# Patient Record
Sex: Female | Born: 1965
Health system: Southern US, Community
[De-identification: ages and names within clinical notes are randomized; demographics above are authoritative.]

## PROBLEM LIST (undated history)

## (undated) DIAGNOSIS — U071 COVID-19: Secondary | ICD-10-CM

## (undated) DIAGNOSIS — G629 Polyneuropathy, unspecified: Secondary | ICD-10-CM

## (undated) HISTORY — PX: APPENDECTOMY: SHX54

## (undated) HISTORY — PX: ABDOMINAL HYSTERECTOMY: SHX81

---

## 2002-12-23 ENCOUNTER — Encounter: Payer: Self-pay | Admitting: Emergency Medicine

## 2002-12-23 ENCOUNTER — Observation Stay (HOSPITAL_COMMUNITY): Admission: EM | Admit: 2002-12-23 | Discharge: 2002-12-24 | Payer: Self-pay | Admitting: Emergency Medicine

## 2004-03-08 ENCOUNTER — Emergency Department (HOSPITAL_COMMUNITY): Admission: EM | Admit: 2004-03-08 | Discharge: 2004-03-08 | Payer: Self-pay | Admitting: Emergency Medicine

## 2006-07-16 ENCOUNTER — Ambulatory Visit: Payer: Self-pay | Admitting: Family Medicine

## 2007-04-07 ENCOUNTER — Ambulatory Visit: Payer: Self-pay | Admitting: Family Medicine

## 2007-11-08 ENCOUNTER — Ambulatory Visit: Payer: Self-pay | Admitting: Family Medicine

## 2009-04-18 ENCOUNTER — Ambulatory Visit: Payer: Self-pay | Admitting: Family Medicine

## 2009-12-22 ENCOUNTER — Ambulatory Visit: Payer: Self-pay | Admitting: Family Medicine

## 2009-12-22 DIAGNOSIS — J309 Allergic rhinitis, unspecified: Secondary | ICD-10-CM | POA: Insufficient documentation

## 2010-04-08 NOTE — Assessment & Plan Note (Signed)
Summary: CUT left HAND/EVM   Vital Signs:  Patient Profile:   45 Years Old Female CC:      cut to left hand Height:     63 inches Weight:      161 pounds BMI:     28.62 O2 Sat:      98 % O2 treatment:    Room Air Pulse rate:   87 / minute Resp:     16 per minute BP sitting:   125 / 86  (left arm)  Pt. in pain?   no  Vitals Entered By: Adella Hare LPN (01/19/2010 3:56 PM)                   Current Allergies (reviewed today): ! CODEINEHistory of Present Illness History from: patient Reason for visit: see chief complaint Chief Complaint: cut to left hand History of Present Illness: This patient presented today to be evaluated for a laceration that she sustained in her left finger. She reported that she was cutting and slicing ham and reported that her dog jumped up on her and caused her to slice her finger. She reports that she was able to stop the bleeding with pressure. She decided to go to the urgent care but they were closed today. She came to [employer] for wound care supplies and saw that our office was open. She came in to have her finger evaluated. She reports that she's having some burning sensation in the area of the laceration around the finger. She has not lost sensation in her fingers and she reports that she has not lost strength in the finger or hand.  REVIEW OF SYSTEMS Constitutional Symptoms      Denies fever, chills, night sweats, weight loss, weight gain, and fatigue.  Eyes       Denies change in vision, eye pain, eye discharge, glasses, contact lenses, and eye surgery. Ear/Nose/Throat/Mouth       Denies hearing loss/aids, change in hearing, ear pain, ear discharge, dizziness, frequent runny nose, frequent nose bleeds, sinus problems, sore throat, hoarseness, and tooth pain or bleeding.  Respiratory       Denies dry cough, productive cough, wheezing, shortness of breath, asthma, bronchitis, and emphysema/COPD.  Cardiovascular       Denies murmurs, chest  pain, and tires easily with exhertion.    Gastrointestinal       Denies stomach pain, nausea/vomiting, diarrhea, constipation, blood in bowel movements, and indigestion. Genitourniary       Denies painful urination, kidney stones, and loss of urinary control. Neurological       Denies paralysis, seizures, and fainting/blackouts. Musculoskeletal       Denies muscle pain, joint pain, joint stiffness, decreased range of motion, redness, swelling, muscle weakness, and gout.      Comments: laceration left hand Skin       Denies bruising, unusual mles/lumps or sores, and hair/skin or nail changes.  Psych       Denies mood changes, temper/anger issues, anxiety/stress, speech problems, depression, and sleep problems.  Past History:  Family History: Last updated: 2010-01-19 Both parents deceased with multiple medical problems (pt not sure of specifics)  Social History: Last updated: 2010/01/19 Pt is a long distance truck driver.  Married Alcohol use-no Drug use-no  Past Medical History: Right thumb mauled by dog bite Allergic rhinitis  Past Surgical History: Repair of right thumb from mauled dog bite Hysterectomy  Current Medications (verified): 1)  None  Allergies (verified): 1)  !  Codeine   Family History: Both parents deceased with multiple medical problems (pt not sure of specifics)  Social History: Pt is a long distance Naval architect.  Married Alcohol use-no Drug use-no Drug Use:  no Physical Exam General appearance: well developed, well nourished, no acute distress Head: normocephalic, atraumatic Eyes: conjunctivae and lids normal Pupils: equal, round, reactive to light Ears: normal, no lesions or deformities Nasal: pale, boggy, swollen nasal turbinates Oral/Pharynx: tongue normal, posterior pharynx without erythema or exudate Neck: neck supple,  trachea midline, no masses Chest/Lungs: no rales, wheezes, or rhonchi bilateral, breath sounds equal without  effort Heart: regular rate and  rhythm, no murmur Abdomen: soft, non-tender without obvious organomegaly Extremities: laceration proximal to left second finger at first MP joint approx 1.5 cm in length,  not bleeding, FROM of finger, normal pulses and gross sensation intact Neurological: grossly intact and non-focal Skin: no obvious rashes or lesions MSE: oriented to time, place, and person Assessment New Problems: ALLERGIC RHINITIS (ICD-477.9) OPEN WOUND FINGER WITHOUT MENTION COMPLICATION (ICD-883.0)   Patient Education: Patient and/or caregiver instructed in the following: rest, Ibuprofen prn. wound care instructions provided Demonstrates willingness to comply.  Plan New Orders: Repair laceration Complex  1.1 CM-2.5 CM [13120] Follow Up: Follow up in 2-3 days if no improvement, Follow up on an as needed basis, Follow up with Primary Physician  The patient and/or caregiver has been counseled thoroughly with regard to medications prescribed including dosage, schedule, interactions, rationale for use, and possible side effects and they verbalize understanding.  Diagnoses and expected course of recovery discussed and will return if not improved as expected or if the condition worsens. Patient and/or caregiver verbalized understanding.   PROCEDURE:  Suture Site: left 2nd finger Size: 1.5 cm Number of Lacerations: 1 Anesthesia: 2% lidocaine without epi Procedure: the wound was thoroughly washed and cleansed  and 1 small suture with 4-0 vicryl was placed to close the wound edges.  minimal blood loss noted Follow up: Pt given instructions to have the suture removed within 5 days.  She was told that she can come to our office to have it removed free of charge.   Patient Instructions: 1)  Please schedule an appointment with your primary doctor in : 1 week 2)  Keep the wound clean and put antibiotic ointment on the wound  2 times per day for at least the next 3 days. 3)  Return within 5  days to have the suture removed.  You have only 1 suture. 4)  If you're still having pain in 24 hours call us so we can send you to have an Xray done like we discussed in clinic today but you declined.   5)  Take 650-1000mg  of Tylenol every 4-6 hours as needed for relief of pain or comfort of fever AVOID taking more than 4000mg   in a 24 hour period (can cause liver damage in higher doses).  Orders Added: 1)  Repair laceration Complex  1.1 CM-2.5 CM [13120]

## 2010-05-06 ENCOUNTER — Ambulatory Visit: Payer: Self-pay | Admitting: Family Medicine

## 2010-07-25 NOTE — Op Note (Signed)
Crystal Nixon, Crystal Nixon                             ACCOUNT NO.:  [id number]   MEDICAL RECORD NO.:  [id number]                   PATIENT TYPE:  INP   LOCATION:  1828                                 FACILITY:  [facility]   PHYSICIAN:  Crystal Nixon, M.D.         DATE OF BIRTH:  1965/06/04   DATE OF PROCEDURE:  12/23/2002  DATE OF DISCHARGE:                                 OPERATIVE REPORT   PREOPERATIVE DIAGNOSIS:  Status post right thumb amputation at the distal  phalanx level with exposed bony architecture and nail bed laceration.   POSTOPERATIVE DIAGNOSIS:  Status post right thumb amputation at the distal  phalanx level with exposed bony architecture and nail bed laceration.   PROCEDURE:  1. Irrigation and debridement right thumb open fracture, including skin and     subcutaneous tissue, bone, tendinous tissue  and nailbed.  2. Nailbed repair, right thumb, complex stellate laceration.  3. Moberg advancement flap right thumb.   SURGEON:  Crystal Nixon, M.D.   ASSISTANT:  None.   COMPLICATIONS:  None.   ANESTHESIA:  General.   TOURNIQUET TIME:  Less than 1 hour.   INDICATIONS FOR PROCEDURE:  This patient is a 45 year old female who  sustained an injury today. This was a distal thumb  amputation. The distal  part is not suitable for replantation and I have recommended an irrigation  and debridement and Moberg advancement flap. I have discussed the risks and  benefits of surgery including  the risks of infection, bleeding, anesthesia,  damage to normal structures and failure of surgery to accomplish its  intended goals of  relief of symptoms and restoration of function. With this  the patient desires to proceed. All questions have been encouraged and  answered preoperatively.   DESCRIPTION OF PROCEDURE:  The patient was successfully induced into general  anesthesia and taken to the operative suite. Preoperatively she was  counseled as to the regards of the risks and  benefits of surgery including  the risks of infection, bleeding, anesthesia, damage to normal structures,  failure of surgery to work and hypersensitivity as well as dystrophy, etc.  She was taken to the operating suite and underwent  prophylactic antibiotic  administration. She was placed supine and given a thorough surgical Betadine  scrub and paint preparation. Sterile draping was secured and body parts were  appropriately padded. General anesthesia was employed under the direction of  Crystal Nixon. Crystal Nixon.   Once this was done the patient then had irrigation and debridement of skin  and subcutaneous tissue, bone and tendinous tissue. This also included an  irrigation and debridement of her nailbed. She tolerated this well without  difficulty and there were no complicating features.   Once this was completed, the patient then underwent a complex repair of her  nailbed with 6-0 and 5-0 chromic suture to my satisfaction without  difficulty. Following complex nail repair the  patient then had incisions  made for a Moberg advancement flap. I incised the mid lateral area without  difficulty. This was done without difficulty.   Once the mid lateral areas were incised I then dissected down just off of  the flexor pollicis longus tendon sheath and created a volar flap. The  neurovascular bundles were contained within the volar flap. This was done  under 4.7 magnification meticulously.   Following this the patient had the edge of the thumb tip treated with a  rongeur and I made sure this was a smooth base. I then flexed this thumb IP  joint slightly and inset the flap with a combination of 4-0 Prolene and 4-0  chromic sutures. She tolerated  this well and had excellent refill to both  the dorsal and volar flap regions and no complicating features. This was  done to my satisfaction without difficulty.   This was done with the tourniquet down (that is the insetting of the flap).  Once  this was done she had Adaptic placed under the nailbed to prevent  nailbed adherence and Xeroform as well as Adaptic was applied followed  by  placement  of a sterile wrap and thumb spica splint.   She tolerated this well without difficulty. She was awakened from anesthesia  and transferred to the recovery room in stable condition. She will be  monitored and placed on antibiotics, pain medications, observation and Nixon  precautions postoperatively. I have discussed her these with her etc., and  all questions have been encouraged and answered.                                                Crystal Nixon, M.D.    Crystal Nixon  D:  12/23/2002  T:  12/24/2002  Job:  [id number]   cc:   Crystal Nixon, M.D.

## 2012-05-30 ENCOUNTER — Ambulatory Visit: Payer: Self-pay

## 2012-05-30 LAB — DOT URINE DIP
Blood: NEGATIVE
Glucose,UR: NEGATIVE mg/dL (ref 0–75)
Specific Gravity: 1.02 (ref 1.003–1.030)

## 2012-09-15 ENCOUNTER — Ambulatory Visit: Payer: Self-pay | Admitting: Family Medicine

## 2018-09-15 ENCOUNTER — Emergency Department: Payer: 59

## 2018-09-15 ENCOUNTER — Other Ambulatory Visit: Payer: Self-pay

## 2018-09-15 ENCOUNTER — Encounter: Payer: Self-pay | Admitting: *Deleted

## 2018-09-15 ENCOUNTER — Emergency Department
Admission: EM | Admit: 2018-09-15 | Discharge: 2018-09-15 | Disposition: A | Discharge status: Home / Self Care | Payer: 59 | Attending: Emergency Medicine | Admitting: Emergency Medicine

## 2018-09-15 DIAGNOSIS — U071 COVID-19: Secondary | ICD-10-CM

## 2018-09-15 DIAGNOSIS — R0602 Shortness of breath: Secondary | ICD-10-CM

## 2018-09-15 HISTORY — DX: COVID-19: U07.1

## 2018-09-15 MED ORDER — HYDROCOD POLST-CPM POLST ER 10-8 MG/5ML PO SUER: 5.0000 mL | Freq: Two times a day (BID) | ORAL | 0 refills | Status: DC | PRN

## 2018-09-15 MED ORDER — HYDROCOD POLST-CPM POLST ER 10-8 MG/5ML PO SUER
5.0000 mL | Freq: Once | ORAL | Status: AC
  Administered 2018-09-15: 5 mL via ORAL
  Filled 2018-09-15: qty 5

## 2018-09-15 MED ORDER — AZITHROMYCIN 250 MG PO TABS: ORAL_TABLET | ORAL | 0 refills | Status: AC

## 2018-09-15 NOTE — ED Triage Notes (Signed)
Pt tested for Covid 19 x 5 days ago and was positive. Pt having increased shortness of breath and is coughing purulent sputum.

## 2018-09-15 NOTE — ED Provider Notes (Signed)
[facility] Emergency Department Provider Note  Time seen: 4:49 AM  I have reviewed the triage vital signs and the nursing notes.   HISTORY  Chief Complaint Shortness of Breath (Covid 19 positive)   HPI Crystal Nixon is a 53 y.o. female with no significant past medical history presents to the emergency department for worsening shortness of breath.  According to the patient for the past 7 days she has been experiencing cough fever congestion, 3 days ago patient tested positive for coronavirus.  Patient states overnight tonight she became very short of breath and was concerned.  States her brother passed away from coronavirus several months ago.  Upon arrival to the emergency department patient states she is feeling better, currently satting between 94 and 98% on room air.  Does have occasional cough.  States she has been experiencing green sputum with her cough.  Denies any chest pain.   Past Medical History:  Diagnosis Date  . COVID-19 virus infection     Patient Active Problem List   Diagnosis Date Noted  . ALLERGIC RHINITIS 12/22/2009    Past Surgical History:  Procedure Laterality Date  . ABDOMINAL HYSTERECTOMY    . APPENDECTOMY      Prior to Admission medications   Not on File    Allergies  Allergen Reactions  . Codeine     History reviewed. No pertinent family history.  Social History Social History   Tobacco Use  . Smoking status: Never Smoker  . Smokeless tobacco: Never Used  Substance Use Topics  . Alcohol use: Never    Frequency: Never  . Drug use: Never    Review of Systems Constitutional: Positive for fever ENT: Positive for congestion Cardiovascular: Negative for chest pain. Respiratory: Positive for shortness of breath.  Positive for cough.  Positive for sputum production. Gastrointestinal: Negative for abdominal pain, vomiting  Musculoskeletal: Negative for musculoskeletal complaints Skin: Negative for skin complaints   Neurological: Negative for headache All other ROS negative  ____________________________________________   PHYSICAL EXAM:  VITAL SIGNS: ED Triage Vitals  Enc Vitals Group     BP 09/15/18 0245 (!) 179/85     Pulse Rate 09/15/18 0245 82     Resp 09/15/18 0245 12     Temp 09/15/18 0246 98.5 F (36.9 C)     Temp Source 09/15/18 0246 Oral     SpO2 09/15/18 0245 95 %     Weight 09/15/18 0248 175 lb (79.4 kg)     Height 09/15/18 0248 5\' 2"  (1.575 m)     Head Circumference --      Peak Flow --      Pain Score 09/15/18 0247 5     Pain Loc --      Pain Edu? --      Excl. in [place]? --    Constitutional: Alert and oriented. Well appearing and in no distress. Eyes: Normal exam ENT      Head: Normocephalic and atraumatic.      Mouth/Throat: Mucous membranes are moist. Cardiovascular: Normal rate, regular rhythm. No murmur Respiratory: Normal respiratory effort without tachypnea nor retractions. Breath sounds are clear.  Frequent cough. Gastrointestinal: Soft and nontender. No distention.   Musculoskeletal: Nontender with normal range of motion in all extremities.  Neurologic:  Normal speech and language. No gross focal neurologic deficits Skin:  Skin is warm, dry and intact.  Psychiatric: Mood and affect are normal.  ____________________________________________   RADIOLOGY  Chest x-ray shows bilateral airspace opacities.  ____________________________________________   INITIAL IMPRESSION / ASSESSMENT AND PLAN / ED COURSE  Pertinent labs & imaging results that were available during my care of the patient were reviewed by me and considered in my medical decision making (see chart for details).   Patient presents to the emergency department for shortness of breath.  Patient tested positive for corona virus 3 days ago.  Patient is describing a initial dry cough that has progressed to a wet cough now with sputum production green in color per patient.  X-ray shows bilateral airspace  opacities.  X-ray is very suggestive of viral pneumonia which would go along with the patient's positive COVID status.  She is currently satting 97% during my evaluation on room air.  However given the progression of a dry cough to sputum production of green sputum production we will place the patient on Zithromax and a cough medication as a precaution.  We will have the patient follow-up with her doctor.  Patient agreeable to plan of care.  Crystal Nixon was evaluated in Emergency Department on 09/15/2018 for the symptoms described in the history of present illness. She was evaluated in the context of the global COVID-19 pandemic, which necessitated consideration that the patient might be at risk for infection with the SARS-CoV-2 virus that causes COVID-19. Institutional protocols and algorithms that pertain to the evaluation of patients at risk for COVID-19 are in a state of rapid change based on information released by regulatory bodies including the CDC and federal and state organizations. These policies and algorithms were followed during the patient's care in the ED.  ____________________________________________   FINAL CLINICAL IMPRESSION(S) / ED DIAGNOSES  COVID-19   Minna AntisPaduchowski, Christabella Alvira, MD 09/15/18 [phone]

## 2018-09-15 NOTE — ED Notes (Signed)
Reference triage note. Pt respirations currently even and unlabored. Pt c/o worsening shortness of breath and cough from recent covid-19 diagnosis,.

## 2018-09-15 NOTE — ED Notes (Signed)
E-signature not working at this time. Pt verbalized understanding of D/C instructions. No further questions at this time. Pt ambulatory and in NAD at time of Martin,

## 2018-09-23 ENCOUNTER — Other Ambulatory Visit: Payer: Self-pay

## 2018-09-23 ENCOUNTER — Encounter: Payer: Self-pay | Admitting: *Deleted

## 2018-09-23 ENCOUNTER — Emergency Department: Payer: 59

## 2018-09-23 ENCOUNTER — Emergency Department
Admission: EM | Admit: 2018-09-23 | Discharge: 2018-09-24 | Disposition: A | Discharge status: Home / Self Care | Payer: 59 | Attending: Emergency Medicine | Admitting: Emergency Medicine

## 2018-09-23 DIAGNOSIS — U071 COVID-19: Secondary | ICD-10-CM | POA: Diagnosis not present

## 2018-09-23 DIAGNOSIS — R05 Cough: Secondary | ICD-10-CM | POA: Diagnosis present

## 2018-09-23 DIAGNOSIS — R0602 Shortness of breath: Secondary | ICD-10-CM | POA: Insufficient documentation

## 2018-09-23 DIAGNOSIS — Z20822 Contact with and (suspected) exposure to covid-19: Secondary | ICD-10-CM

## 2018-09-23 MED ORDER — HYDROCOD POLST-CPM POLST ER 10-8 MG/5ML PO SUER: 5.0000 mL | Freq: Two times a day (BID) | ORAL | 0 refills | Status: DC | PRN

## 2018-09-23 NOTE — ED Provider Notes (Signed)
[facility] Emergency Department Provider Note  Time seen: 10:36 PM  I have reviewed the triage vital signs and the nursing notes.   HISTORY  Chief Complaint Shortness of Breath   HPI Crystal Nixon is a 53 y.o. female diagnosed with coronavirus 09/14/2018 presents to the emergency department for continued cough and shortness of breath.  According to the patient she is continued to have low-grade temperatures around 99, states she finished her cough medication and antibiotic however continues to have cough and shortness of breath.  States the health department has been calling her daily and today she was complaining of worsening cough so they said she should come back to the emergency department.  Patient states she has a pulse oximeter at home, her pulse ox has maintained between 90 to 98% throughout her illness per patient.  Denies any significant shortness of breath or chest pain at this time but does state increased cough.  Denies abdominal pain vomiting but does state occasional diarrhea.  Currently the patient appears well, sitting in a chair, no acute distress, occasional cough during examination.  Past Medical History:  Diagnosis Date  . COVID-19 virus infection     Patient Active Problem List   Diagnosis Date Noted  . ALLERGIC RHINITIS 12/22/2009    Past Surgical History:  Procedure Laterality Date  . ABDOMINAL HYSTERECTOMY    . APPENDECTOMY      Prior to Admission medications   Medication Sig Start Date End Date Taking? Authorizing Provider  chlorpheniramine-HYDROcodone (TUSSIONEX PENNKINETIC ER) 10-8 MG/5ML SUER Take 5 mLs by mouth every 12 (twelve) hours as needed for cough. 09/15/18   Harvest Dark, MD    Allergies  Allergen Reactions  . Codeine     No family history on file.  Social History Social History   Tobacco Use  . Smoking status: Never Smoker  . Smokeless tobacco: Never Used  Substance Use Topics  . Alcohol use: Never   Frequency: Never  . Drug use: Never    Review of Systems Constitutional: Intermittent low-grade fevers ENT: Mild congestion Cardiovascular: Negative for chest pain. Respiratory: Shortness of breath is improving, continues to have cough. Gastrointestinal: Negative for abdominal pain, vomiting Musculoskeletal: Negative for musculoskeletal complaints Skin: Negative for skin complaints  Neurological: Negative for headache All other ROS negative  ____________________________________________   PHYSICAL EXAM:  VITAL SIGNS: ED Triage Vitals  Enc Vitals Group     BP 09/23/18 2203 (!) 164/125     Pulse Rate 09/23/18 2203 93     Resp 09/23/18 2203 (!) 22     Temp 09/23/18 2203 99.2 F (37.3 C)     Temp Source 09/23/18 2203 Oral     SpO2 09/23/18 2203 97 %     Weight --      Height --      Head Circumference --      Peak Flow --      Pain Score 09/23/18 2204 0     Pain Loc --      Pain Edu? --      Excl. in [place]? --    Constitutional: Alert and oriented. Well appearing and in no distress. Eyes: Normal exam ENT      Head: Normocephalic and atraumatic.      Mouth/Throat: Mucous membranes are moist. Cardiovascular: Normal rate, regular rhythm. No murmur Respiratory: Normal respiratory effort without tachypnea nor retractions. Breath sounds are clear.  Occasional cough during exam. Gastrointestinal: Soft and nontender. No distention.  Musculoskeletal:  Nontender with normal range of motion in all extremities. Neurologic:  Normal speech and language. No gross focal neurologic deficits  Skin:  Skin is warm, dry and intact.  Psychiatric: Mood and affect are normal.      RADIOLOGY  Chest x-ray appears clear.  ____________________________________________   INITIAL IMPRESSION / ASSESSMENT AND PLAN / ED COURSE  Pertinent labs & imaging results that were available during my care of the patient were reviewed by me and considered in my medical decision making (see chart for  details).   Patient presents emergency department for cough, known COVID positive status.  Differential would include worsening coronavirus, continued coronavirus illness, pneumonia.  Overall the patient appears well, low-grade temperature 99.2, 97% room air saturation, no acute distress.  We will repeat a chest x-ray and continue to closely monitor.  As the patient is satting in the upper 90s on room air anticipate likely discharge home as long as her chest x-ray appears to improve.  Chest x-ray appears clear.  Patient satting in the upper 90s.  I discussed continued supportive care at home, as well as return precautions.  Crystal Nixon was evaluated in Emergency Department on 09/23/2018 for the symptoms described in the history of present illness. She was evaluated in the context of the global COVID-19 pandemic, which necessitated consideration that the patient might be at risk for infection with the SARS-CoV-2 virus that causes COVID-19. Institutional protocols and algorithms that pertain to the evaluation of patients at risk for COVID-19 are in a state of rapid change based on information released by regulatory bodies including the CDC and federal and state organizations. These policies and algorithms were followed during the patient's care in the ED.  ____________________________________________   FINAL CLINICAL IMPRESSION(S) / ED DIAGNOSES  COVID-19   Minna AntisPaduchowski, Lyal Husted, MD 09/23/18 2240

## 2018-09-23 NOTE — ED Notes (Signed)
ED Provider at bedside.  Pt reports cough, and dx of COVID on 8 July, also at that time pt reports dx of bilateral PNE, pt reports pulse OX at home between 92-98%, pt reports regular tylenol use for fever, pt reports regular contact at home with health dept, pt reports also taking cough suppressant and good fluid intake, and some diarrhea from taking Azithro  EDP coaching pt on COVID s/sx management

## 2018-09-23 NOTE — ED Triage Notes (Signed)
Pt ambulatory to triage. Pt is positive for covid.  Pt states she is more sob.  No chest pain.  Pt is a long distance truck driver.  Pt alert speech clear.  Pt taken to room 4.

## 2018-09-24 NOTE — ED Notes (Signed)
No peripheral IV placed this visit.   Discharge instructions reviewed with patient. Questions fielded by this RN. Patient verbalizes understanding of instructions. Patient discharged home in stable condition per paduchowski. No acute distress noted at time of discharge.   Pt ambulatory to DC

## 2018-09-28 ENCOUNTER — Telehealth: Payer: Self-pay | Admitting: General Practice

## 2018-09-28 LAB — NOVEL CORONAVIRUS, NAA: SARS-CoV-2, NAA: NOT DETECTED

## 2018-09-28 NOTE — Telephone Encounter (Signed)
Pt needs the generic lady of her negative test results mailed to her home. Pt has no internet at the moment and only a flip phone.  A duplicate chart was created on this pt, so her results are in the [facility] chart. I have marked the chart for merge.

## 2018-09-28 NOTE — Telephone Encounter (Signed)
Per pt's request, mailed negative COVID test results to home address.

## 2018-10-03 ENCOUNTER — Other Ambulatory Visit: Payer: Self-pay

## 2018-10-03 DIAGNOSIS — Z20822 Contact with and (suspected) exposure to covid-19: Secondary | ICD-10-CM

## 2018-10-06 LAB — NOVEL CORONAVIRUS, NAA: SARS-CoV-2, NAA: NOT DETECTED

## 2018-10-10 ENCOUNTER — Telehealth: Payer: Self-pay

## 2018-10-10 NOTE — Telephone Encounter (Signed)
Per patient request, mailed COVID test results to her home address.

## 2019-01-12 ENCOUNTER — Ambulatory Visit (INDEPENDENT_AMBULATORY_CARE_PROVIDER_SITE_OTHER): Payer: 59 | Admitting: Family Medicine

## 2019-01-12 ENCOUNTER — Encounter: Payer: Self-pay | Admitting: Family Medicine

## 2019-01-12 ENCOUNTER — Other Ambulatory Visit: Payer: Self-pay

## 2019-01-12 VITALS — BP 167/101 | HR 89 | Temp 96.9°F | Ht 62.0 in | Wt 177.0 lb

## 2019-01-12 DIAGNOSIS — Z6832 Body mass index (BMI) 32.0-32.9, adult: Secondary | ICD-10-CM

## 2019-01-12 DIAGNOSIS — G629 Polyneuropathy, unspecified: Secondary | ICD-10-CM | POA: Diagnosis not present

## 2019-01-12 DIAGNOSIS — I10 Essential (primary) hypertension: Secondary | ICD-10-CM

## 2019-01-12 DIAGNOSIS — N951 Menopausal and female climacteric states: Secondary | ICD-10-CM

## 2019-01-12 DIAGNOSIS — E669 Obesity, unspecified: Secondary | ICD-10-CM | POA: Diagnosis not present

## 2019-01-12 DIAGNOSIS — Z803 Family history of malignant neoplasm of breast: Secondary | ICD-10-CM

## 2019-01-12 DIAGNOSIS — Z1231 Encounter for screening mammogram for malignant neoplasm of breast: Secondary | ICD-10-CM

## 2019-01-12 MED ORDER — GABAPENTIN 300 MG PO CAPS: 300.0000 mg | ORAL_CAPSULE | Freq: Three times a day (TID) | ORAL | 3 refills | Status: DC

## 2019-01-12 MED ORDER — HYDROCHLOROTHIAZIDE 12.5 MG PO TABS: 12.5000 mg | ORAL_TABLET | Freq: Every day | ORAL | 3 refills | Status: DC

## 2019-01-12 NOTE — Progress Notes (Signed)
Patient: Crystal Nixon, Female    DOB: 1965/10/20, 53 y.o.   MRN: [id number] Visit Date: 01/12/2019  Today's Provider: Shirlee Latch, MD   Chief Complaint  Patient presents with  . Establish Care  . Hypertension  . Foot Pain   Subjective:     Annual physical exam Crystal Nixon is a 53 y.o. female who presents today to establish care. She feels fairly well.  She reports exercising regularly. She reports she is sleeping well.   She states she has not had a primary care doctor in many years.  She works as a Naval architect. ----------------------------------------------------------------- Pt is complaining about numbness and pain in her feet. Pt states she takes "30-35 Ibuprofen 200mg  a day". She tried a frined's gabapentin and it helps.  Affecting her job as a .  No history of alcohol use or diabetes.  Ongoing for a few years.  Not worsening.  Patient has also been experiencing hot flashes and sweating for a few months.  She also has decreased libido and vaginal dryness.  Lubricant is helping some with this.  She had a hysterectomy in 2002.  She is wondering whether she is menopausal  She has noticed that her blood pressure is running high for many months while checking it at home.  She occasionally takes her boyfriends HCTZ, but she has never been diagnosed with hypertension or taken a medication for it that was prescribed.  Family history of breast cancer in mother and MGM.  States that she was told that she needed extra imaging and bioppsy after last mammogram and she never went back.   Review of Systems  Constitutional: Positive for diaphoresis and fatigue. Negative for activity change, appetite change, chills, fever and unexpected weight change.  HENT: Negative.   Eyes: Negative.   Respiratory: Negative.   Cardiovascular: Negative.   Gastrointestinal: Negative.   Endocrine: Negative.   Genitourinary: Negative.   Musculoskeletal: Negative.    Skin: Positive for rash (Constant rashes/breakouts). Negative for color change, pallor and wound.  Allergic/Immunologic: Negative.   Neurological: Negative.   Hematological: Negative.   Psychiatric/Behavioral: Negative.     Social History      She  reports that she has never smoked. She has never used smokeless tobacco. She reports that she does not drink alcohol or use drugs.       Social History   Socioeconomic History  . Marital status: Married    Spouse name: Not on file  . Number of children: Not on file  . Years of education: Not on file  . Highest education level: Not on file  Occupational History  . Not on file  Social Needs  . Financial resource strain: Not on file  . Food insecurity    Worry: Not on file    Inability: Not on file  . Transportation needs    Medical: Not on file    Non-medical: Not on file  Tobacco Use  . Smoking status: Never Smoker  . Smokeless tobacco: Never Used  Substance and Sexual Activity  . Alcohol use: Never    Frequency: Never  . Drug use: Never  . Sexual activity: Not on file  Lifestyle  . Physical activity    Days per week: Not on file    Minutes per session: Not on file  . Stress: Not on file  Relationships  . Social 2003 on phone: Not on file    Gets together:  Not on file    Attends religious service: Not on file    Active member of club or organization: Not on file    Attends meetings of clubs or organizations: Not on file    Relationship status: Not on file  Other Topics Concern  . Not on file  Social History Narrative  . Not on file    Past Medical History:  Diagnosis Date  . COVID-19 virus infection      Patient Active Problem List   Diagnosis Date Noted  . ALLERGIC RHINITIS 12/22/2009    Past Surgical History:  Procedure Laterality Date  . ABDOMINAL HYSTERECTOMY    . APPENDECTOMY      Family History        Family Status  Relation Name Status  . Mother  Deceased  . Father   Deceased  . Brother  Deceased  . MGM  Deceased        Her family history includes ALS in her mother; Breast cancer in her maternal grandmother and mother; Stroke in her father.      Allergies  Allergen Reactions  . Codeine      Current Outpatient Medications:  .  chlorpheniramine-HYDROcodone (TUSSIONEX PENNKINETIC ER) 10-8 MG/5ML SUER, Take 5 mLs by mouth every 12 (twelve) hours as needed for cough., Disp: 140 mL, Rfl: 0   Patient Care Team: Erasmo DownerBacigalupo, Jax Kentner M, MD as PCP - General (Family Medicine)    Objective:    Vitals: BP (!) 167/97 (BP Location: Right Arm, Patient Position: Sitting, Cuff Size: Normal)   Pulse 89   Temp (!) 96.9 F (36.1 C) (Axillary)   Ht 5\' 2"  (1.575 m)   Wt 177 lb (80.3 kg)   BMI 32.37 kg/m    Vitals:   01/12/19 1445  BP: (!) 167/97  Pulse: 89  Temp: (!) 96.9 F (36.1 C)  TempSrc: Axillary  Weight: 177 lb (80.3 kg)  Height: 5\' 2"  (1.575 m)     Physical Exam Vitals signs reviewed.  Constitutional:      General: She is not in acute distress.    Appearance: Normal appearance. She is well-developed. She is not diaphoretic.  HENT:     Head: Normocephalic and atraumatic.     Right Ear: Tympanic membrane, ear canal and external ear normal.     Left Ear: Tympanic membrane, ear canal and external ear normal.  Eyes:     General: No scleral icterus.    Conjunctiva/sclera: Conjunctivae normal.     Pupils: Pupils are equal, round, and reactive to light.  Neck:     Musculoskeletal: Neck supple.     Thyroid: No thyromegaly.  Cardiovascular:     Rate and Rhythm: Normal rate and regular rhythm.     Pulses: Normal pulses.     Heart sounds: Normal heart sounds. No murmur.  Pulmonary:     Effort: Pulmonary effort is normal. No respiratory distress.     Breath sounds: Normal breath sounds. No wheezing or rales.  Abdominal:     General: There is no distension.     Palpations: Abdomen is soft.     Tenderness: There is no abdominal tenderness.   Musculoskeletal:        General: No deformity.     Right lower leg: No edema.     Left lower leg: No edema.  Lymphadenopathy:     Cervical: No cervical adenopathy.  Skin:    General: Skin is warm and dry.     Capillary Refill:  Capillary refill takes less than 2 seconds.     Findings: No rash.  Neurological:     Mental Status: She is alert and oriented to person, place, and time. Mental status is at baseline.     Sensory: Sensory deficit (in bilateral feet) present.  Psychiatric:        Mood and Affect: Mood normal.        Behavior: Behavior normal.        Thought Content: Thought content normal.      Depression Screen No flowsheet data found.     Assessment & Plan:     Establish care  Exercise Activities and Dietary recommendations Goals   None      There is no immunization history on file for this patient.  Health Maintenance  Topic Date Due  . HIV Screening  05/14/1981  . TETANUS/TDAP  05/14/1985  . PAP SMEAR-Modifier  05/15/1987  . MAMMOGRAM  05/14/2016  . COLONOSCOPY  05/14/2016  . INFLUENZA VACCINE  06/07/2019 (Originally 10/08/2018)     Discussed health benefits of physical activity, and encouraged her to engage in regular exercise appropriate for her age and condition.    -------------------------------------------------------------------- Problem List Items Addressed This Visit      Cardiovascular and Mediastinum   Essential hypertension    New diagnosis Seems it has been ongoing for many months to years Discussed low-sodium diet and exercise Discussed importance of good blood pressure control Start HCTZ 12.5 mg daily Check metabolic panel At follow-up, consider dose titration      Relevant Medications   hydrochlorothiazide (HYDRODIURIL) 12.5 MG tablet   Other Relevant Orders   CMP (Comprehensive metabolic panel) (Completed)     Nervous and Auditory   Peripheral polyneuropathy - Primary    New diagnosis Unclear etiology, but will check  B12 and A1c 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 Given that it is bilateral, doubt that it is a more central problem Advised patient to significantly cut back/avoid NSAID use Check metabolic panel to ensure no kidney injury in the setting of excessive NSAID use Start gabapentin 300 mg 3 times daily At follow-up, can consider dose titration if needed      Relevant Medications   gabapentin (NEURONTIN) 300 MG capsule   Other Relevant Orders   B12 (Completed)   CMP (Comprehensive metabolic panel) (Completed)   Hemoglobin A1c (Completed)   CBC (Completed)   TSH (Completed)     Other   Class 1 obesity without serious comorbidity with body mass index (BMI) of 32.0 to 32.9 in adult    Discussed importance of healthy weight management Discussed diet and exercise      Relevant Orders   CMP (Comprehensive metabolic panel) (Completed)   Lipid panel (Completed)   Hemoglobin A1c (Completed)   CBC (Completed)   TSH (Completed)   Family history of breast cancer    Given patient's significant family history of breast cancer, I encouraged her to get annual mammograms, especially if she has had an abnormal one in the past (results are not available for this) She agrees to call and schedule a mammogram      Relevant Orders   MM 3D SCREEN BREAST BILATERAL   Vasomotor symptoms due to menopause    Patient is likely perimenopausal or postmenopausal We will check FSH and LH today Discussed symptomatic management and lifestyle changes Gabapentin is likely to also help with hot flashes      Relevant Orders   FSH/LH (Completed)  Other Visit Diagnoses    Encounter for screening mammogram for malignant neoplasm of breast       Relevant Orders   MM 3D SCREEN BREAST BILATERAL       Return in about 6 weeks (around 02/23/2019) for neuropathy and BP f/u.   The entirety of the information documented in the History of Present Illness, Review of Systems and Physical  Exam were personally obtained by me. Portions of this information were initially documented by Ashley Royalty, CMA and reviewed by me for thoroughness and accuracy.    Brenda Cowher, Dionne Bucy, MD MPH [facility]

## 2019-01-12 NOTE — Patient Instructions (Signed)
Neuropathic Pain Neuropathic pain is pain caused by damage to the nerves that are responsible for certain sensations in your body (sensory nerves). The pain can be caused by:  Damage to the sensory nerves that send signals to your spinal cord and brain (peripheral nervous system).  Damage to the sensory nerves in your brain or spinal cord (central nervous system). Neuropathic pain can make you more sensitive to pain. Even a minor sensation can feel very painful. This is usually a long-term condition that can be difficult to treat. The type of pain differs from person to person. It may:  Start suddenly (acute), or it may develop slowly and last for a long time (chronic).  Come and go as damaged nerves heal, or it may stay at the same level for years.  Cause emotional distress, loss of sleep, and a lower quality of life. What are the causes? The most common cause of this condition is diabetes. Many other diseases and conditions can also cause neuropathic pain. Causes of neuropathic pain can be classified as:  Toxic. This is caused by medicines and chemicals. The most common cause of toxic neuropathic pain is damage from cancer treatments (chemotherapy).  Metabolic. This can be caused by: ? Diabetes. This is the most common disease that damages the nerves. ? Lack of vitamin B from long-term alcohol abuse.  Traumatic. Any injury that cuts, crushes, or stretches a nerve can cause damage and pain. A common example is feeling pain after losing an arm or leg (phantom limb pain).  Compression-related. If a sensory nerve gets trapped or compressed for a long period of time, the blood supply to the nerve can be cut off.  Vascular. Many blood vessel diseases can cause neuropathic pain by decreasing blood supply and oxygen to nerves.  Autoimmune. This type of pain results from diseases in which the body's defense system (immune system) mistakenly attacks sensory nerves. Examples of autoimmune diseases  that can cause neuropathic pain include lupus and multiple sclerosis.  Infectious. Many types of viral infections can damage sensory nerves and cause pain. Shingles infection is a common cause of this type of pain.  Inherited. Neuropathic pain can be a symptom of many diseases that are passed down through families (genetic). What increases the risk? You are more likely to develop this condition if:  You have diabetes.  You smoke.  You drink too much alcohol.  You are taking certain medicines, including medicines that kill cancer cells (chemotherapy) or that treat immune system disorders. What are the signs or symptoms? The main symptom is pain. Neuropathic pain is often described as:  Burning.  Shock-like.  Stinging.  Hot or cold.  Itching. How is this diagnosed? No single test can diagnose neuropathic pain. It is diagnosed based on:  Physical exam and your symptoms. Your health care provider will ask you about your pain. You may be asked to use a pain scale to describe how bad your pain is.  Tests. These may be done to see if you have a high sensitivity to pain and to help find the cause and location of any sensory nerve damage. They include: ? Nerve conduction studies to test how well nerve signals travel through your sensory nerves (electrodiagnostic testing). ? Stimulating your sensory nerves through electrodes on your skin and measuring the response in your spinal cord and brain (somatosensory evoked potential).  Imaging studies, such as: ? X-rays. ? CT scan. ? MRI. How is this treated? Treatment for neuropathic pain may change   over time. You may need to try different treatment options or a combination of treatments. Some options include:  Treating the underlying cause of the neuropathy, such as diabetes, kidney disease, or vitamin deficiencies.  Stopping medicines that can cause neuropathy, such as chemotherapy.  Medicine to relieve pain. Medicines may  include: ? Prescription or over-the-counter pain medicine. ? Anti-seizure medicine. ? Antidepressant medicines. ? Pain-relieving patches that are applied to painful areas of skin. ? A medicine to numb the area (local anesthetic), which can be injected as a nerve block.  Transcutaneous nerve stimulation. This uses electrical currents to block painful nerve signals. The treatment is painless.  Alternative treatments, such as: ? Acupuncture. ? Meditation. ? Massage. ? Physical therapy. ? Pain management programs. ? Counseling. Follow these instructions at home: Medicines   Take over-the-counter and prescription medicines only as told by your health care provider.  Do not drive or use heavy machinery while taking prescription pain medicine.  If you are taking prescription pain medicine, take actions to prevent or treat constipation. Your health care provider may recommend that you: ? Drink enough fluid to keep your urine pale yellow. ? Eat foods that are high in fiber, such as fresh fruits and vegetables, whole grains, and beans. ? Limit foods that are high in fat and processed sugars, such as fried or sweet foods. ? Take an over-the-counter or prescription medicine for constipation. Lifestyle   Have a good support system at home.  Consider joining a chronic pain support group.  Do not use any products that contain nicotine or tobacco, such as cigarettes and e-cigarettes. If you need help quitting, ask your health care provider.  Do not drink alcohol. General instructions  Learn as much as you can about your condition.  Work closely with all your health care providers to find the treatment plan that works best for you.  Ask your health care provider what activities are safe for you.  Keep all follow-up visits as told by your health care provider. This is important. Contact a health care provider if:  Your pain treatments are not working.  You are having side effects  from your medicines.  You are struggling with tiredness (fatigue), mood changes, depression, or anxiety. Summary  Neuropathic pain is pain caused by damage to the nerves that are responsible for certain sensations in your body (sensory nerves).  Neuropathic pain may come and go as damaged nerves heal, or it may stay at the same level for years.  Neuropathic pain is usually a long-term condition that can be difficult to treat. Consider joining a chronic pain support group. This information is not intended to replace advice given to you by your health care provider. Make sure you discuss any questions you have with your health care provider. Document Released: 11/21/2003 Document Revised: 06/16/2018 Document Reviewed: 03/12/2017 Elsevier Patient Education  2020 [employer]  

## 2019-01-13 ENCOUNTER — Telehealth: Payer: Self-pay

## 2019-01-13 DIAGNOSIS — Z803 Family history of malignant neoplasm of breast: Secondary | ICD-10-CM | POA: Insufficient documentation

## 2019-01-13 DIAGNOSIS — I10 Essential (primary) hypertension: Secondary | ICD-10-CM | POA: Insufficient documentation

## 2019-01-13 DIAGNOSIS — G629 Polyneuropathy, unspecified: Secondary | ICD-10-CM | POA: Insufficient documentation

## 2019-01-13 DIAGNOSIS — N951 Menopausal and female climacteric states: Secondary | ICD-10-CM | POA: Insufficient documentation

## 2019-01-13 DIAGNOSIS — E669 Obesity, unspecified: Secondary | ICD-10-CM | POA: Insufficient documentation

## 2019-01-13 LAB — CBC
Hematocrit: 40 % (ref 34.0–46.6)
Hemoglobin: 13.2 g/dL (ref 11.1–15.9)
MCH: 26.6 pg (ref 26.6–33.0)
MCHC: 33 g/dL (ref 31.5–35.7)
MCV: 81 fL (ref 79–97)
Platelets: 487 10*3/uL — ABNORMAL HIGH (ref 150–450)
RBC: 4.97 x10E6/uL (ref 3.77–5.28)
RDW: 13.6 % (ref 11.7–15.4)
WBC: 12 10*3/uL — ABNORMAL HIGH (ref 3.4–10.8)

## 2019-01-13 LAB — COMPREHENSIVE METABOLIC PANEL
ALT: 26 IU/L (ref 0–32)
AST: 21 IU/L (ref 0–40)
Albumin/Globulin Ratio: 1.6 (ref 1.2–2.2)
Albumin: 4.6 g/dL (ref 3.8–4.9)
Alkaline Phosphatase: 98 IU/L (ref 39–117)
BUN/Creatinine Ratio: 23 (ref 9–23)
BUN: 16 mg/dL (ref 6–24)
Bilirubin Total: <0.2 mg/dL (ref 0.0–1.2)
CO2: 24 mmol/L (ref 20–29)
Calcium: 10 mg/dL (ref 8.7–10.2)
Chloride: 102 mmol/L (ref 96–106)
Creatinine, Ser: 0.71 mg/dL (ref 0.57–1.00)
GFR calc Af Amer: 113 mL/min/{1.73_m2} (ref >59)
GFR calc non Af Amer: 98 mL/min/{1.73_m2} (ref >59)
Globulin, Total: 2.9 g/dL (ref 1.5–4.5)
Glucose: 78 mg/dL (ref 65–99)
Potassium: 4.4 mmol/L (ref 3.5–5.2)
Sodium: 141 mmol/L (ref 134–144)
Total Protein: 7.5 g/dL (ref 6.0–8.5)

## 2019-01-13 LAB — HEMOGLOBIN A1C
Est. average glucose Bld gHb Est-mCnc: 114 mg/dL
Hgb A1c MFr Bld: 5.6 % (ref 4.8–5.6)

## 2019-01-13 LAB — TSH: TSH: 1.42 u[IU]/mL (ref 0.450–4.500)

## 2019-01-13 LAB — FSH/LH
FSH: 70.3 m[IU]/mL
LH: 39.3 m[IU]/mL

## 2019-01-13 LAB — LIPID PANEL
Chol/HDL Ratio: 6.7 ratio — ABNORMAL HIGH (ref 0.0–4.4)
Cholesterol, Total: 228 mg/dL — ABNORMAL HIGH (ref 100–199)
HDL: 34 mg/dL — ABNORMAL LOW (ref >39)
LDL Chol Calc (NIH): 95 mg/dL (ref 0–99)
Triglycerides: 594 mg/dL (ref 0–149)
VLDL Cholesterol Cal: 99 mg/dL — ABNORMAL HIGH (ref 5–40)

## 2019-01-13 LAB — VITAMIN B12: Vitamin B-12: 509 pg/mL (ref 232–1245)

## 2019-01-13 NOTE — Assessment & Plan Note (Signed)
Discussed importance of healthy weight management Discussed diet and exercise  

## 2019-01-13 NOTE — Assessment & Plan Note (Signed)
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

## 2019-01-13 NOTE — Assessment & Plan Note (Signed)
Given patient's significant family history of breast cancer, I encouraged her to get annual mammograms, especially if she has had an abnormal one in the past (results are not available for this) She agrees to call and schedule a mammogram

## 2019-01-13 NOTE — Assessment & Plan Note (Signed)
New diagnosis Seems it has been ongoing for many months to years Discussed low-sodium diet and exercise Discussed importance of good blood pressure control Start HCTZ 12.5 mg daily Check metabolic panel At follow-up, consider dose titration

## 2019-01-13 NOTE — Assessment & Plan Note (Signed)
Patient is likely perimenopausal or postmenopausal We will check Krebs and LH today Discussed symptomatic management and lifestyle changes Gabapentin is likely to also help with hot flashes

## 2019-01-13 NOTE — Telephone Encounter (Signed)
-----   Message from Virginia Crews, MD sent at 01/13/2019  8:54 AM EST ----- Normal labs - except:  Patient is postmenopausal.  Normal A1c, no diabetes.  Cholesterol is elevated, but especially her triglycerides.  Recommend decreasing carbohydrates in her diet.  Would recheck fasting at her next visit.  White blood cell count and platelet count are slightly elevated.  Suspect that patient was slightly dehydrated and this is reflective of hemoconcentration.  We can recheck this again at next visit as well

## 2019-01-13 NOTE — Telephone Encounter (Signed)
Pt advised.   Thanks,   -Jestin Burbach  

## 2019-03-13 ENCOUNTER — Other Ambulatory Visit: Payer: Self-pay

## 2019-03-14 ENCOUNTER — Ambulatory Visit (INDEPENDENT_AMBULATORY_CARE_PROVIDER_SITE_OTHER): Payer: 59 | Admitting: Family Medicine

## 2019-03-14 ENCOUNTER — Encounter: Payer: Self-pay | Admitting: Family Medicine

## 2019-03-14 VITALS — BP 126/88 | HR 99 | Temp 96.8°F | Wt 173.0 lb

## 2019-03-14 DIAGNOSIS — D582 Other hemoglobinopathies: Secondary | ICD-10-CM

## 2019-03-14 DIAGNOSIS — E782 Mixed hyperlipidemia: Secondary | ICD-10-CM

## 2019-03-14 DIAGNOSIS — I10 Essential (primary) hypertension: Secondary | ICD-10-CM | POA: Diagnosis not present

## 2019-03-14 DIAGNOSIS — Z23 Encounter for immunization: Secondary | ICD-10-CM

## 2019-03-14 DIAGNOSIS — G629 Polyneuropathy, unspecified: Secondary | ICD-10-CM | POA: Diagnosis not present

## 2019-03-14 MED ORDER — GABAPENTIN 300 MG PO CAPS: 600.0000 mg | ORAL_CAPSULE | Freq: Three times a day (TID) | ORAL | 3 refills | Status: DC

## 2019-03-14 NOTE — Patient Instructions (Signed)
Increase gabapentin to 600mg  three times daily Continue blood pressure medicine   High Cholesterol  High cholesterol is a condition in which the blood has high levels of a white, waxy, fat-like substance (cholesterol). The human body needs small amounts of cholesterol. The liver makes all the cholesterol that the body needs. Extra (excess) cholesterol comes from the food that we eat. Cholesterol is carried from the liver by the blood through the blood vessels. If you have high cholesterol, deposits (plaques) may build up on the walls of your blood vessels (arteries). Plaques make the arteries narrower and stiffer. Cholesterol plaques increase your risk for heart attack and stroke. Work with your health care provider to keep your cholesterol levels in a healthy range. What increases the risk? This condition is more likely to develop in people who:  Eat foods that are high in animal fat (saturated fat) or cholesterol.  Are overweight.  Are not getting enough exercise.  Have a family history of high cholesterol. What are the signs or symptoms? There are no symptoms of this condition. How is this diagnosed? This condition may be diagnosed from the results of a blood test.  If you are older than age 61, your health care provider may check your cholesterol every 4-6 years.  You may be checked more often if you already have high cholesterol or other risk factors for heart disease. The blood test for cholesterol measures:  "Bad" cholesterol (LDL cholesterol). This is the main type of cholesterol that causes heart disease. The desired level for LDL is less than 100.  "Good" cholesterol (HDL cholesterol). This type helps to protect against heart disease by cleaning the arteries and carrying the LDL away. The desired level for HDL is 60 or higher.  Triglycerides. These are fats that the body can store or burn for energy. The desired number for triglycerides is lower than 150.  Total  cholesterol. This is a measure of the total amount of cholesterol in your blood, including LDL cholesterol, HDL cholesterol, and triglycerides. A healthy number is less than 200. How is this treated? This condition is treated with diet changes, lifestyle changes, and medicines. Diet changes  This may include eating more whole grains, fruits, vegetables, nuts, and fish.  This may also include cutting back on red meat and foods that have a lot of added sugar. Lifestyle changes  Changes may include getting at least 40 minutes of aerobic exercise 3 times a week. Aerobic exercises include walking, biking, and swimming. Aerobic exercise along with a healthy diet can help you maintain a healthy weight.  Changes may also include quitting smoking. Medicines  Medicines are usually given if diet and lifestyle changes have failed to reduce your cholesterol to healthy levels.  Your health care provider may prescribe a statin medicine. Statin medicines have been shown to reduce cholesterol, which can reduce the risk of heart disease. Follow these instructions at home: Eating and drinking If told by your health care provider:  Eat chicken (without skin), fish, veal, shellfish, ground 26 breast, and round or loin cuts of red meat.  Do not eat fried foods or fatty meats, such as hot dogs and salami.  Eat plenty of fruits, such as apples.  Eat plenty of vegetables, such as broccoli, potatoes, and carrots.  Eat beans, peas, and lentils.  Eat grains such as barley, rice, couscous, and bulgur wheat.  Eat pasta without cream sauces.  Use skim or nonfat milk, and eat low-fat or nonfat yogurt and cheeses.  Do not eat or drink whole milk, cream, ice cream, egg yolks, or hard cheeses.  Do not eat stick margarine or tub margarines that contain trans fats (also called partially hydrogenated oils).  Do not eat saturated tropical oils, such as coconut oil and palm oil.  Do not eat cakes, cookies,  crackers, or other baked goods that contain trans fats.  General instructions  Exercise as directed by your health care provider. Increase your activity level with activities such as gardening, walking, and taking the stairs.  Take over-the-counter and prescription medicines only as told by your health care provider.  Do not use any products that contain nicotine or tobacco, such as cigarettes and e-cigarettes. If you need help quitting, ask your health care provider.  Keep all follow-up visits as told by your health care provider. This is important. Contact a health care provider if:  You are struggling to maintain a healthy diet or weight.  You need help to start on an exercise program.  You need help to stop smoking. Get help right away if:  You have chest pain.  You have trouble breathing. This information is not intended to replace advice given to you by your health care provider. Make sure you discuss any questions you have with your health care provider. Document Revised: 02/26/2017 Document Reviewed: 08/24/2015 Elsevier Patient Education  [employer].

## 2019-03-14 NOTE — Assessment & Plan Note (Signed)
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

## 2019-03-14 NOTE — Progress Notes (Signed)
Patient: Crystal Sabet Female    DOB: [birth year]   54 y.o.   MRN: [id number] Visit Date: 03/14/2019  Today's Provider: [name], MD   Chief Complaint  Patient presents with  . Hypertension  . Peripheral Neuropathy   Subjective:     Hypertension This is a new problem. The problem has been rapidly worsening (120-140/80's) since onset. Pertinent negatives include no anxiety, blurred vision, chest pain, headaches, malaise/fatigue, neck pain (Bilateral feet), orthopnea, palpitations, peripheral edema, PND, shortness of breath or sweats. Past treatments include diuretics. There are no compliance problems.    Since starting HCTZ at her last visit, her blood pressure has improved.  She is taking this with good compliance.  She denies any hypotension.  She is not having any side effects from the medication.  She is not checking her blood pressure at home currently  Peripheral neuropathy: Patient started taking gabapentin 300 mg 3 times daily at last visit.  She states she had some drowsiness/intoxication symptoms after the first dose, but since then she is tolerated well without any side effects.  She feels like it also helps her be more focused and calm.  She reports that it is helping significantly with her neuropathy, as she has much worse symptoms if she misses a dose.  She reports that she does still have some symptoms, however and wonders if she needs to take this more frequently or at a higher dose.  Allergies  Allergen Reactions  . Codeine      Current Outpatient Medications:  .  gabapentin (NEURONTIN) 300 MG capsule, Take 1 capsule (300 mg total) by mouth 3 (three) times daily., Disp: 90 capsule, Rfl: 3 .  hydrochlorothiazide (HYDRODIURIL) 12.5 MG tablet, Take 1 tablet (12.5 mg total) by mouth daily., Disp: 30 tablet, Rfl: 3  Review of Systems  Constitutional: Negative.  Negative for malaise/fatigue.  Eyes: Negative for blurred vision.  Respiratory: Negative.   Negative for shortness of breath.   Cardiovascular: Negative.  Negative for chest pain, palpitations, orthopnea and PND.  Gastrointestinal: Positive for diarrhea. Negative for abdominal distention, abdominal pain, anal bleeding, blood in stool, constipation, nausea, rectal pain and vomiting.  Musculoskeletal: Positive for myalgias. Negative for arthralgias, back pain, gait problem, joint swelling, neck pain (Bilateral feet) and neck stiffness.  Neurological: Positive for numbness (Bilateral feet). Negative for dizziness, light-headedness and headaches.    Social History   Tobacco Use  . Smoking status: Never Smoker  . Smokeless tobacco: Never Used  Substance Use Topics  . Alcohol use: Never      Objective:   BP 126/88 (BP Location: Right Arm, Patient Position: Sitting, Cuff Size: Normal)   Pulse 99   Temp (!) 96.8 F (36 C) (Temporal)   Wt 173 lb (78.5 kg)   SpO2 98%   BMI 31.64 kg/m  Vitals:   03/14/19 0803  BP: 126/88  Pulse: 99  Temp: (!) 96.8 F (36 C)  TempSrc: Temporal  SpO2: 98%  Weight: 173 lb (78.5 kg)  Body mass index is 31.64 kg/m.   Physical Exam Vitals reviewed.  Constitutional:      General: She is not in acute distress.    Appearance: Normal appearance. She is well-developed. She is not diaphoretic.  HENT:     Head: Normocephalic and atraumatic.  Eyes:     General: No scleral icterus.    Conjunctiva/sclera: Conjunctivae normal.  Neck:     Thyroid: No thyromegaly.  Cardiovascular:  Rate and Rhythm: Normal rate and regular rhythm.     Pulses: Normal pulses.     Heart sounds: Normal heart sounds. No murmur.  Pulmonary:     Effort: Pulmonary effort is normal. No respiratory distress.     Breath sounds: Normal breath sounds. No wheezing, rhonchi or rales.  Musculoskeletal:     Cervical back: Neck supple.     Right lower leg: No edema.     Left lower leg: No edema.  Lymphadenopathy:     Cervical: No cervical adenopathy.  Skin:    General:  Skin is warm and dry.     Capillary Refill: Capillary refill takes less than 2 seconds.     Findings: No rash.  Neurological:     Mental Status: She is alert and oriented to person, place, and time. Mental status is at baseline.  Psychiatric:        Mood and Affect: Mood normal.        Behavior: Behavior normal.      No results found for any visits on 03/14/19.     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 Increase gabapentin to 600 mg 3 times daily Advised to increase the dose when she is not going to be driving for the next few days Follow-up in 3 months      Relevant Medications   gabapentin (NEURONTIN) 300 MG capsule    Other Visit Diagnoses    Mixed hyperlipidemia       Relevant Orders   Lipid panel   Elevated hemoglobin (HCC)       Relevant Orders   CBC   Need for influenza vaccination       Relevant Orders   Flu Vaccine QUAD 6+ mos PF IM (Fluarix Quad PF) (Completed)   Need for Tdap vaccination       Relevant Orders   Tdap vaccine greater than or equal to 7yo IM (Completed)       Return in about 3 months (around 06/12/2019) for chronic disease f/u.   The entirety of the information documented in the History of Present Illness, Review of Systems and Physical Exam were personally obtained by me. Portions of this information were initially documented by Kavin Leech, CMA and reviewed by me for thoroughness and accuracy.    Lilyonna Steidle, Marzella Schlein, MD MPH [facility]

## 2019-03-14 NOTE — Assessment & Plan Note (Signed)
Well controlled Continue current medications Recheck metabolic panel F/u in 3 months  

## 2019-03-15 ENCOUNTER — Telehealth: Payer: Self-pay

## 2019-03-15 LAB — BASIC METABOLIC PANEL
BUN/Creatinine Ratio: 26 — ABNORMAL HIGH (ref 9–23)
BUN: 18 mg/dL (ref 6–24)
CO2: 23 mmol/L (ref 20–29)
Calcium: 10.6 mg/dL — ABNORMAL HIGH (ref 8.7–10.2)
Chloride: 94 mmol/L — ABNORMAL LOW (ref 96–106)
Creatinine, Ser: 0.69 mg/dL (ref 0.57–1.00)
GFR calc Af Amer: 116 mL/min/{1.73_m2} (ref >59)
GFR calc non Af Amer: 100 mL/min/{1.73_m2} (ref >59)
Glucose: 96 mg/dL (ref 65–99)
Potassium: 4.6 mmol/L (ref 3.5–5.2)
Sodium: 137 mmol/L (ref 134–144)

## 2019-03-15 LAB — CBC
Hematocrit: 44.4 % (ref 34.0–46.6)
Hemoglobin: 14.7 g/dL (ref 11.1–15.9)
MCH: 26.7 pg (ref 26.6–33.0)
MCHC: 33.1 g/dL (ref 31.5–35.7)
MCV: 81 fL (ref 79–97)
Platelets: 439 10*3/uL (ref 150–450)
RBC: 5.5 x10E6/uL — ABNORMAL HIGH (ref 3.77–5.28)
RDW: 13.9 % (ref 11.7–15.4)
WBC: 9.8 10*3/uL (ref 3.4–10.8)

## 2019-03-15 LAB — LIPID PANEL
Chol/HDL Ratio: 5.4 ratio — ABNORMAL HIGH (ref 0.0–4.4)
Cholesterol, Total: 198 mg/dL (ref 100–199)
HDL: 37 mg/dL — ABNORMAL LOW (ref >39)
LDL Chol Calc (NIH): 79 mg/dL (ref 0–99)
Triglycerides: 512 mg/dL — ABNORMAL HIGH (ref 0–149)
VLDL Cholesterol Cal: 82 mg/dL — ABNORMAL HIGH (ref 5–40)

## 2019-03-15 NOTE — Telephone Encounter (Signed)
-----   Message from Erasmo Downer, MD sent at 03/15/2019  8:20 AM EST ----- Normal labs, except for elevated calcium (recommend holding any multivitamin or calcium supplements and rechecking at next visit) and triglycerides (decrease carbohydrate intake and work on getting some exercise)

## 2019-03-15 NOTE — Telephone Encounter (Signed)
Patient advised as below.  

## 2019-04-12 ENCOUNTER — Other Ambulatory Visit: Payer: Self-pay | Admitting: Family Medicine

## 2019-04-12 ENCOUNTER — Telehealth: Payer: Self-pay

## 2019-04-12 DIAGNOSIS — Z1231 Encounter for screening mammogram for malignant neoplasm of breast: Secondary | ICD-10-CM

## 2019-04-12 DIAGNOSIS — N6310 Unspecified lump in the right breast, unspecified quadrant: Secondary | ICD-10-CM

## 2019-04-12 MED ORDER — HYDROCHLOROTHIAZIDE 12.5 MG PO TABS: 12.5000 mg | ORAL_TABLET | Freq: Every day | ORAL | 3 refills | Status: DC

## 2019-04-12 NOTE — Telephone Encounter (Signed)
Copied from CRM [id number]. Topic: Referral - Request for Referral >> Apr 12, 2019 11:14 AM Wyonia Hough E wrote: Has patient seen PCP for this complaint? Yes  *If NO, is insurance requiring patient see PCP for this issue before PCP can refer them? Referral for which specialty: mammogram  Preferred provider/office: [facility] on [address]  Reason for referral: Pt had a mammogram in 2014 and is due for another but Provider needs to send over for a Diagnostic an Bilateral sent over / an order for a mammogram was already sent but they need this in addition because of what was found in 2014 results

## 2019-04-18 NOTE — Telephone Encounter (Signed)
[facility] states they need both  left and right limited breast ultrasounds before they can schedule appointment

## 2019-04-19 NOTE — Addendum Note (Signed)
Addended by: Erasmo Downer on: 04/19/2019 08:29 AM   Modules accepted: Orders

## 2019-05-15 ENCOUNTER — Ambulatory Visit (INDEPENDENT_AMBULATORY_CARE_PROVIDER_SITE_OTHER): Payer: 59 | Admitting: Family Medicine

## 2019-05-15 ENCOUNTER — Other Ambulatory Visit: Payer: Self-pay

## 2019-05-15 ENCOUNTER — Ambulatory Visit: Payer: 59 | Admitting: Family Medicine

## 2019-05-15 ENCOUNTER — Encounter: Payer: Self-pay | Admitting: Family Medicine

## 2019-05-15 VITALS — BP 148/92 | HR 102 | Temp 97.7°F | Wt 186.0 lb

## 2019-05-15 DIAGNOSIS — I10 Essential (primary) hypertension: Secondary | ICD-10-CM | POA: Diagnosis not present

## 2019-05-15 DIAGNOSIS — G629 Polyneuropathy, unspecified: Secondary | ICD-10-CM | POA: Diagnosis not present

## 2019-05-15 DIAGNOSIS — E669 Obesity, unspecified: Secondary | ICD-10-CM | POA: Diagnosis not present

## 2019-05-15 DIAGNOSIS — Z6832 Body mass index (BMI) 32.0-32.9, adult: Secondary | ICD-10-CM

## 2019-05-15 DIAGNOSIS — F4321 Adjustment disorder with depressed mood: Secondary | ICD-10-CM

## 2019-05-15 MED ORDER — HYDROCHLOROTHIAZIDE 25 MG PO TABS: 25.0000 mg | ORAL_TABLET | Freq: Every day | ORAL | 3 refills | Status: DC

## 2019-05-15 NOTE — Assessment & Plan Note (Signed)
Uncontrolled Increase HCTZ to 25mg  daily  Repeat CMP F/u in 4-6 weeks

## 2019-05-15 NOTE — Progress Notes (Deleted)
       Patient: Crystal Nixon Female    DOB: 11/21/1965   54 y.o.   MRN: [id number] Visit Date: 05/15/2019  Today's Provider: Shirlee Latch, MD   No chief complaint on file.  Subjective:    I Crystal Nixon, CMA, am acting as scribe for Shirlee Latch, MD.  HPI  Hypertension, follow-up:  BP Readings from Last 3 Encounters:  03/14/19 126/88  01/12/19 (!) 167/101  09/24/18 (!) 147/97    She was last seen for hypertension 2 months ago.  BP at that visit was 126/88. Management changes since that visit include no changes. She reports {excellent/good/fair/poor:19665} compliance with treatment. She {ACTION; IS/IS HUD:[id number]} having side effects. *** She {is/is not:9024} exercising. She {is/is not:9024} adherent to low salt diet.   Outside blood pressures are ***. She is experiencing {Symptoms; cardiac:12860}.  Patient denies {Symptoms; cardiac:12860}.   Cardiovascular risk factors include {cv risk factors:510}.  Use of agents associated with hypertension: {bp agents assoc with hypertension:511::"none"}.     Weight trend: {trend:16658} Wt Readings from Last 3 Encounters:  03/14/19 173 lb (78.5 kg)  01/12/19 177 lb (80.3 kg)  09/15/18 175 lb (79.4 kg)   Current diet: {diet habits:16563} ------------------------------------------------------------------------   Follow up for peripheral polybeuropathy  The patient was last seen for this 2 months ago. Changes made at last visit include increase gabapentin to 600 mg 3 times daily.  She reports {excellent/good/fair/poor:19665} compliance with treatment. She feels that condition is {improved/worse/unchanged:3041574}. She {ACTION; IS/IS ZCH:[id number]} having side effects. ***  ------------------------------------------------------------------------------------    Allergies  Allergen Reactions  . Codeine      Current Outpatient Medications:  .  gabapentin (NEURONTIN) 300 MG capsule, Take 2 capsules  (600 mg total) by mouth 3 (three) times daily., Disp: 180 capsule, Rfl: 3 .  hydrochlorothiazide (HYDRODIURIL) 12.5 MG tablet, Take 1 tablet (12.5 mg total) by mouth daily., Disp: 30 tablet, Rfl: 3  Review of Systems  Social History   Tobacco Use  . Smoking status: Never Smoker  . Smokeless tobacco: Never Used  Substance Use Topics  . Alcohol use: Never      Objective:   There were no vitals taken for this visit. There were no vitals filed for this visit.There is no height or weight on file to calculate BMI.   Physical Exam   No results found for any visits on 05/15/19.     Assessment & Plan        Shirlee Latch, MD  [facility]

## 2019-05-15 NOTE — Assessment & Plan Note (Signed)
Grief at loss of her brother 1 yr ago Denies depression, but does get tearful at time

## 2019-05-15 NOTE — Progress Notes (Signed)
Patient: Crystal Nixon Female    DOB: [birth year]   54 y.o.   MRN: [id number] Visit Date: 05/15/2019  Today's Provider: Shirlee Latch, MD   Chief Complaint  Patient presents with  . Hypertension  . Peripheral Neuropathy   Subjective:     HPI    Hypertension, follow-up:  BP Readings from Last 3 Encounters:  05/15/19 (!) 148/92  03/14/19 126/88  01/12/19 (!) 167/101    She was last seen for hypertension 3 months ago.  BP at that visit was 126/88. Management since that visit includes No changes. She reports excellent compliance with treatment. She is not having side effects.  She is not exercising. She is not adherent to low salt diet.   Outside blood pressures are not being checked. She is experiencing none.  Patient denies chest pain, dyspnea, fatigue, lower extremity edema and palpitations.   Cardiovascular risk factors include advanced age (older than 71 for men, 13 for women), hypertension and obesity (BMI >= 30 kg/m2).  Use of agents associated with hypertension: none.     Weight trend: increasing steadily Wt Readings from Last 3 Encounters:  05/15/19 186 lb (84.4 kg)  03/14/19 173 lb (78.5 kg)  01/12/19 177 lb (80.3 kg)    Current diet: in general, an "unhealthy" diet  ------------------------------------------------------------------------    Follow up for Peripheral polyneuropathy  The patient was last seen for this 3 months ago. Changes made at last visit include increased gabapentin to 600mg  three times a day.  She reports excellent compliance with treatment. She feels that condition is worse.  Pt states she is not able to work. Also effecting her sleep.  She is having side effects.   ------------------------------------------------------------------------------------ Pt is due to have her Calcium rechecked.  It was elevated at last visit.    struggling with death of her brother 1 yr ago he was cremated before she was able to see  him or hear about it denies depression but cries a lot   Allergies  Allergen Reactions  . Codeine      Current Outpatient Medications:  .  gabapentin (NEURONTIN) 300 MG capsule, Take 2 capsules (600 mg total) by mouth 3 (three) times daily., Disp: 180 capsule, Rfl: 3 .  hydrochlorothiazide (HYDRODIURIL) 25 MG tablet, Take 1 tablet (25 mg total) by mouth daily., Disp: 30 tablet, Rfl: 3  Review of Systems  Constitutional: Negative.   Cardiovascular: Negative.   Gastrointestinal: Negative for abdominal distention, anal bleeding, blood in stool, constipation and rectal pain.  Musculoskeletal: Positive for back pain and myalgias.  Neurological: Positive for numbness. Negative for dizziness, weakness and light-headedness.    Social History   Tobacco Use  . Smoking status: Never Smoker  . Smokeless tobacco: Never Used  Substance Use Topics  . Alcohol use: Never      Objective:   BP (!) 148/92   Pulse (!) 102   Temp 97.7 F (36.5 C) (Temporal)   Wt 186 lb (84.4 kg)   BMI 34.02 kg/m  Vitals:   05/15/19 1353 05/15/19 1448  BP: (!) 150/86 (!) 148/92  Pulse: (!) 102   Temp: 97.7 F (36.5 C)   TempSrc: Temporal   Weight: 186 lb (84.4 kg)   Body mass index is 34.02 kg/m.   Physical Exam Vitals reviewed.  Constitutional:      General: She is not in acute distress.    Appearance: Normal appearance. She is well-developed. She is not diaphoretic.  HENT:  Head: Normocephalic and atraumatic.  Eyes:     General: No scleral icterus.    Conjunctiva/sclera: Conjunctivae normal.  Neck:     Thyroid: No thyromegaly.  Cardiovascular:     Rate and Rhythm: Normal rate and regular rhythm.     Pulses: Normal pulses.     Heart sounds: Normal heart sounds. No murmur.  Pulmonary:     Effort: Pulmonary effort is normal. No respiratory distress.     Breath sounds: Normal breath sounds. No wheezing, rhonchi or rales.  Musculoskeletal:     Cervical back: Neck supple.     Right  lower leg: No edema.     Left lower leg: No edema.  Lymphadenopathy:     Cervical: No cervical adenopathy.  Skin:    General: Skin is warm and dry.     Findings: No rash.  Neurological:     Mental Status: She is alert and oriented to person, place, and time. Mental status is at baseline.  Psychiatric:        Mood and Affect: Mood normal.        Behavior: Behavior normal.      No results found for any visits on 05/15/19.     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  TID Referral to neurology for further work-up F/u prn      Relevant Orders   Ambulatory referral to Neurology     Other   Class 1 obesity without serious comorbidity with body mass index (BMI) of 32.0 to 32.9 in adult    Discussed importance of healthy weight management Discussed diet and exercise       Grief    Grief at loss of her brother 1 yr ago Denies depression, but does get tearful at time       Other Visit Diagnoses    Hypercalcemia       Relevant Orders   Comprehensive metabolic panel   VITAMIN D 25 Hydroxy (Vit-D Deficiency, Fractures)       Return in about 6 weeks (around 06/26/2019) for chronic disease f/u.   The entirety of the information documented in the History of Present Illness, Review of Systems and Physical Exam were personally obtained by me. Portions of this information were initially documented by Ashley Royalty,  CMA and reviewed by me for thoroughness and accuracy.    Stepheny Canal, Dionne Bucy, MD MPH [facility]

## 2019-05-15 NOTE — Assessment & Plan Note (Signed)
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

## 2019-05-15 NOTE — Assessment & Plan Note (Signed)
Discussed importance of healthy weight management Discussed diet and exercise  

## 2019-05-15 NOTE — Patient Instructions (Signed)

## 2019-05-16 ENCOUNTER — Telehealth: Payer: Self-pay

## 2019-05-16 LAB — COMPREHENSIVE METABOLIC PANEL
ALT: 26 IU/L (ref 0–32)
AST: 21 IU/L (ref 0–40)
Albumin/Globulin Ratio: 1.4 (ref 1.2–2.2)
Albumin: 4.3 g/dL (ref 3.8–4.9)
Alkaline Phosphatase: 91 IU/L (ref 39–117)
BUN/Creatinine Ratio: 28 — ABNORMAL HIGH (ref 9–23)
BUN: 18 mg/dL (ref 6–24)
Bilirubin Total: <0.2 mg/dL (ref 0.0–1.2)
CO2: 22 mmol/L (ref 20–29)
Calcium: 9.4 mg/dL (ref 8.7–10.2)
Chloride: 103 mmol/L (ref 96–106)
Creatinine, Ser: 0.64 mg/dL (ref 0.57–1.00)
GFR calc Af Amer: 118 mL/min/{1.73_m2} (ref >59)
GFR calc non Af Amer: 102 mL/min/{1.73_m2} (ref >59)
Globulin, Total: 3 g/dL (ref 1.5–4.5)
Glucose: 84 mg/dL (ref 65–99)
Potassium: 4.4 mmol/L (ref 3.5–5.2)
Sodium: 141 mmol/L (ref 134–144)
Total Protein: 7.3 g/dL (ref 6.0–8.5)

## 2019-05-16 LAB — VITAMIN D 25 HYDROXY (VIT D DEFICIENCY, FRACTURES): Vit D, 25-Hydroxy: 16.6 ng/mL — ABNORMAL LOW (ref 30.0–100.0)

## 2019-05-16 NOTE — Telephone Encounter (Signed)
Patient advised as below. Patient verbalizes understanding and is in agreement with treatment plan.  

## 2019-05-16 NOTE — Telephone Encounter (Signed)
-----   Message from Erasmo Downer, MD sent at 05/16/2019  8:07 AM EST ----- Normal labs, except low vitamin D. Recommend supplement with Vit D3 2000 units daily.

## 2019-05-23 ENCOUNTER — Ambulatory Visit
Admission: RE | Admit: 2019-05-23 | Discharge: 2019-05-23 | Disposition: A | Discharge status: Home / Self Care | Payer: 59 | Source: Ambulatory Visit | Attending: Family Medicine | Admitting: Family Medicine

## 2019-05-23 DIAGNOSIS — Z1231 Encounter for screening mammogram for malignant neoplasm of breast: Secondary | ICD-10-CM | POA: Diagnosis present

## 2019-05-23 DIAGNOSIS — N6310 Unspecified lump in the right breast, unspecified quadrant: Secondary | ICD-10-CM

## 2019-05-24 ENCOUNTER — Telehealth: Payer: Self-pay

## 2019-05-24 NOTE — Telephone Encounter (Signed)
Pt advised.   Thanks,   -Alianis Trimmer  

## 2019-05-24 NOTE — Telephone Encounter (Signed)
-----   Message from Erasmo Downer, MD sent at 05/24/2019  8:06 AM EDT ----- Normal mammogram. Repeat in 1 yr

## 2019-06-02 ENCOUNTER — Ambulatory Visit: Payer: 59 | Admitting: Family Medicine

## 2019-06-02 ENCOUNTER — Other Ambulatory Visit: Payer: Self-pay

## 2019-06-02 ENCOUNTER — Encounter: Payer: Self-pay | Admitting: Family Medicine

## 2019-06-02 VITALS — BP 144/89 | HR 85 | Temp 97.3°F | Wt 188.0 lb

## 2019-06-02 DIAGNOSIS — F4323 Adjustment disorder with mixed anxiety and depressed mood: Secondary | ICD-10-CM

## 2019-06-02 DIAGNOSIS — G629 Polyneuropathy, unspecified: Secondary | ICD-10-CM

## 2019-06-02 DIAGNOSIS — F321 Major depressive disorder, single episode, moderate: Secondary | ICD-10-CM | POA: Diagnosis not present

## 2019-06-02 MED ORDER — DULOXETINE HCL 30 MG PO CPEP: 30.0000 mg | ORAL_CAPSULE | Freq: Every day | ORAL | 3 refills | Status: DC
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
## 2019-06-02 NOTE — Patient Instructions (Signed)
Managing Loss, Adult People experience loss in many different ways throughout their lives. Events such as moving, changing jobs, and losing friends can create a sense of loss. The loss may be as serious as a major health change, divorce, death of a pet, or death of a loved one. All of these types of loss are likely to create a physical and emotional reaction known as grief. Grief is the result of a major change or an absence of something or someone that you count on. Grief is a normal reaction to loss. A variety of factors can affect your grieving experience, including:  The nature of your loss.  Your relationship to what or whom you lost.  Your understanding of grief and how to manage it.  Your support system. How to manage lifestyle changes Keep to your normal routine as much as possible.  If you have trouble focusing or doing normal activities, it is acceptable to take some time away from your normal routine.  Spend time with friends and loved ones.  Eat a healthy diet, get plenty of sleep, and rest when you feel tired. How to recognize changes  The way that you deal with your grief will affect your ability to function as you normally do. When grieving, you may experience these changes:  Numbness, shock, sadness, anxiety, anger, denial, and guilt.  Thoughts about death.  Unexpected crying.  A physical sensation of emptiness in your stomach.  Problems sleeping and eating.  Tiredness (fatigue).  Loss of interest in normal activities.  Dreaming about or imagining seeing the person who died.  A need to remember what or whom you lost.  Difficulty thinking about anything other than your loss for a period of time.  Relief. If you have been expecting the loss for a while, you may feel a sense of relief when it happens. Follow these instructions at home:  Activity Express your feelings in healthy ways, such as:  Talking with others about your loss. It may be helpful to find  others who have had a similar loss, such as a support group.  Writing down your feelings in a journal.  Doing physical activities to release stress and emotional energy.  Doing creative activities like painting, sculpting, or playing or listening to music.  Practicing resilience. This is the ability to recover and adjust after facing challenges. Reading some resources that encourage resilience may help you to learn ways to practice those behaviors. General instructions  Be patient with yourself and others. Allow the grieving process to happen, and remember that grieving takes time. ? It is likely that you may never feel completely done with some grief. You may find a way to move on while still cherishing memories and feelings about your loss. ? Accepting your loss is a process. It can take months or longer to adjust.  Keep all follow-up visits as told by your health care provider. This is important. Where to find support To get support for managing loss:  Ask your health care provider for help and recommendations, such as grief counseling or therapy.  Think about joining a support group for people who are managing a loss. Where to find more information You can find more information about managing loss from:  American Society of Clinical Oncology: www.cancer.net  American Psychological Association: www.apa.org Contact a health care provider if:  Your grief is extreme and keeps getting worse.  You have ongoing grief that does not improve.  Your body shows symptoms of grief, such   as illness.  You feel depressed, anxious, or lonely. Get help right away if:  You have thoughts about hurting yourself or others. If you ever feel like you may hurt yourself or others, or have thoughts about taking your own life, get help right away. You can go to your nearest emergency department or call:  Your local emergency services (911 in the [place]  A suicide crisis helpline, such as the  National Suicide Prevention Lifeline at 1-800-273-8255. This is open 24 hours a day. Summary  Grief is the result of a major change or an absence of someone or something that you count on. Grief is a normal reaction to loss.  The depth of grief and the period of recovery depend on the type of loss and your ability to adjust to the change and process your feelings.  Processing grief requires patience and a willingness to accept your feelings and talk about your loss with people who are supportive.  It is important to find resources that work for you and to realize that people experience grief differently. There is not one grieving process that works for everyone in the same way.  Be aware that when grief becomes extreme, it can lead to more severe issues like isolation, depression, anxiety, or suicidal thoughts. Talk with your health care provider if you have any of these issues. This information is not intended to replace advice given to you by your health care provider. Make sure you discuss any questions you have with your health care provider. Document Revised: 04/29/2018 Document Reviewed: 07/09/2016 Elsevier Patient Education  2020 Elsevier Inc.  

## 2019-06-04 DIAGNOSIS — F4323 Adjustment disorder with mixed anxiety and depressed mood: Secondary | ICD-10-CM | POA: Insufficient documentation

## 2019-06-04 DIAGNOSIS — F321 Major depressive disorder, single episode, moderate: Secondary | ICD-10-CM | POA: Insufficient documentation

## 2019-06-04 NOTE — Assessment & Plan Note (Signed)
Pt with recent life stressors with symptoms of depression Symptom screening is consistent with diagnosis of MDD PHQ9 today is 78 GAD7 today is 56  Plan: Discussed with pt the efficacy of duel medication and counseling treatment Pt has declined counseling at this time Recommend starting on duloxitine for symptoms of depression and anxiety along with symptoms of neuropathy

## 2019-06-04 NOTE — Assessment & Plan Note (Signed)
Pt with recent life stressors with symptoms of depression Symptom screening is consistent with diagnosis of MDD PHQ9 today is 17 GAD7 today is 19  Plan: Discussed with pt the efficacy of duel medication and counseling treatment Pt has declined counseling at this time Recommend starting on duloxitine for symptoms of depression and anxiety along with symptoms of neuropathy 

## 2019-06-04 NOTE — Assessment & Plan Note (Signed)
Pt with history of peripheral neuropathy that is uncontrolled and worsening Unclear etiology, no DM, B12 deficiency, alcohol use Pt is taking gabapentin 600mg  TID with limited improvement  Plan: Recommend to the pt to follow up with neurology to make sure an appointment is set Continue taking gabapentin 600mg  TID Recommend starting duloxetine for nerve pain in addition to symptoms of depression and anxiety

## 2019-06-05 ENCOUNTER — Encounter: Payer: Self-pay | Admitting: Family Medicine

## 2019-07-06 ENCOUNTER — Ambulatory Visit: Payer: 59 | Admitting: Family Medicine

## 2019-08-17 ENCOUNTER — Ambulatory Visit (INDEPENDENT_AMBULATORY_CARE_PROVIDER_SITE_OTHER): Payer: 59 | Admitting: Family Medicine

## 2019-08-17 ENCOUNTER — Other Ambulatory Visit: Payer: Self-pay | Admitting: Family Medicine

## 2019-08-17 ENCOUNTER — Encounter: Payer: Self-pay | Admitting: Family Medicine

## 2019-08-17 ENCOUNTER — Other Ambulatory Visit: Payer: Self-pay

## 2019-08-17 VITALS — BP 119/77 | HR 85 | Temp 96.6°F | Resp 16 | Wt 178.0 lb

## 2019-08-17 DIAGNOSIS — G629 Polyneuropathy, unspecified: Secondary | ICD-10-CM | POA: Diagnosis not present

## 2019-08-17 DIAGNOSIS — I10 Essential (primary) hypertension: Secondary | ICD-10-CM | POA: Diagnosis not present

## 2019-08-17 DIAGNOSIS — Z1159 Encounter for screening for other viral diseases: Secondary | ICD-10-CM

## 2019-08-17 DIAGNOSIS — Z1211 Encounter for screening for malignant neoplasm of colon: Secondary | ICD-10-CM

## 2019-08-17 DIAGNOSIS — E669 Obesity, unspecified: Secondary | ICD-10-CM

## 2019-08-17 DIAGNOSIS — F4323 Adjustment disorder with mixed anxiety and depressed mood: Secondary | ICD-10-CM

## 2019-08-17 DIAGNOSIS — Z114 Encounter for screening for human immunodeficiency virus [HIV]: Secondary | ICD-10-CM

## 2019-08-17 DIAGNOSIS — Z6832 Body mass index (BMI) 32.0-32.9, adult: Secondary | ICD-10-CM

## 2019-08-17 MED ORDER — GABAPENTIN 300 MG PO CAPS: 600.0000 mg | ORAL_CAPSULE | Freq: Three times a day (TID) | ORAL | 3 refills | Status: DC

## 2019-08-17 NOTE — Assessment & Plan Note (Addendum)
uncontrolled Has seen Dr. Malvin Johns Taking gabapentin and nortriptyline  Patient to follow up with Dr. Malvin Johns Patient does have EMG appointment 6/25

## 2019-08-17 NOTE — Telephone Encounter (Signed)
[facility] faxed refill request for the following medications:  gabapentin (NEURONTIN) 300 MG capsule  Please advise.  Thanks, [employer]

## 2019-08-17 NOTE — Assessment & Plan Note (Addendum)
Well controlled Continue current medications Recheck metabolic panel F/u in 8 months

## 2019-08-17 NOTE — Progress Notes (Signed)
Established patient visit   Patient: Crystal Nixon   DOB: 12-06-1965   54 y.o. Female  MRN: [id number] Visit Date: 08/17/2019  Today's healthcare provider: [name], MD   Chief Complaint  Patient presents with   Depression   Hypertension   Subjective    HPI Depression/Anxiety, Follow-up  She  was last seen for this 3 months ago. Changes made at last visit include start Duloxetine 30mg  daily.   She reports poor compliance with treatment. She is having side effects. Nausea  She reports fair to poor tolerance of treatment. Current symptoms include: fatigue and insomnia She feels she is Improved since last visit.  Depression screen [facility] 2/9 08/17/2019 06/02/2019 01/12/2019  Decreased Interest 0 3 0  Down, Depressed, Hopeless 0 3 0  PHQ - 2 Score 0 6 0  Altered sleeping 1 2 0  Tired, decreased energy 3 2 1   Change in appetite 0 3 3  Feeling bad or failure about yourself  0 3 0  Trouble concentrating 0 1 0  Moving slowly or fidgety/restless 0 0 0  Suicidal thoughts 0 0 0  PHQ-9 Score 4 17 4   Difficult doing work/chores Somewhat difficult Extremely dIfficult Not difficult at all    ----------------------------------------------------------------------------------------- Hypertension, follow-up  BP Readings from Last 3 Encounters:  08/17/19 119/77  06/02/19 (!) 144/89  05/15/19 (!) 148/92   Wt Readings from Last 3 Encounters:  08/17/19 178 lb (80.7 kg)  06/02/19 188 lb (85.3 kg)  05/15/19 186 lb (84.4 kg)     She was last seen for hypertension 3 months ago.  BP at that visit was 148/89. Management since that visit includes increase HCTZ to 25mg  daily.  She reports excellent compliance with treatment. She is not having side effects.  She is following a Regular diet. She is not exercising. She does not smoke.  Use of agents associated with hypertension: none.   Outside blood pressures are not being checked. Symptoms: No chest pain No  chest pressure  No palpitations No syncope  No dyspnea No orthopnea  No paroxysmal nocturnal dyspnea No lower extremity edema   Pertinent labs: Lab Results  Component Value Date   CHOL 198 03/14/2019   HDL 37 (L) 03/14/2019   LDLCALC 79 03/14/2019   TRIG 512 (H) 03/14/2019   CHOLHDL 5.4 (H) 03/14/2019   Lab Results  Component Value Date   NA 141 05/15/2019   K 4.4 05/15/2019   CREATININE 0.64 05/15/2019   GFRNONAA 102 05/15/2019   GFRAA 118 05/15/2019   GLUCOSE 84 05/15/2019     The 10-year ASCVD risk score Mikey Bussing DC Jr., et al., 2013) is: 2.9%   ---------------------------------------------------------------------------------------------------   Patient Active Problem List   Diagnosis Date Noted   Adjustment disorder with mixed anxiety and depressed mood 06/04/2019   Current moderate episode of major depressive disorder without prior episode ([place]) 06/04/2019   Grief 05/15/2019   Peripheral polyneuropathy 01/13/2019   Class 1 obesity without serious comorbidity with body mass index (BMI) of 32.0 to 32.9 in adult 01/13/2019   Essential hypertension 01/13/2019   Family history of breast cancer 01/13/2019   Vasomotor symptoms due to menopause 01/13/2019   ALLERGIC RHINITIS 12/22/2009   Social History   Tobacco Use   Smoking status: Never Smoker   Smokeless tobacco: Never Used  Vaping Use   Vaping Use: Never used  Substance Use Topics   Alcohol use: Never   Drug use: Never  Medications: Outpatient Medications Prior to Visit  Medication Sig   gabapentin (NEURONTIN) 300 MG capsule Take 2 capsules (600 mg total) by mouth 3 (three) times daily.   hydrochlorothiazide (HYDRODIURIL) 25 MG tablet Take 1 tablet (25 mg total) by mouth daily.   nortriptyline (PAMELOR) 10 MG capsule START WITH 1 CAPSULE BY MOUTH NIGHTLY FOR ONE WEEK THEN INCREASE TO 2 CAPSULES NIGHTLY   DULoxetine (CYMBALTA) 30 MG capsule Take 1 capsule (30 mg total) by mouth daily.  (Patient not taking: Reported on 08/17/2019)   No facility-administered medications prior to visit.    Review of Systems  Constitutional: Negative.   Respiratory: Negative.   Cardiovascular: Negative.       Objective    BP 119/77 (BP Location: Left Arm, Patient Position: Sitting, Cuff Size: Large)    Pulse 85    Temp (!) 96.6 F (35.9 C) (Temporal)    Resp 16    Wt 178 lb (80.7 kg)    BMI 32.56 kg/m  BP Readings from Last 3 Encounters:  08/17/19 119/77  06/02/19 (!) 144/89  05/15/19 (!) 148/92   Wt Readings from Last 3 Encounters:  08/17/19 178 lb (80.7 kg)  06/02/19 188 lb (85.3 kg)  05/15/19 186 lb (84.4 kg)      Physical Exam Vitals reviewed.  Constitutional:      General: She is not in acute distress.    Appearance: Normal appearance. She is well-developed. She is not diaphoretic.  HENT:     Head: Normocephalic and atraumatic.  Eyes:     General: No scleral icterus.    Conjunctiva/sclera: Conjunctivae normal.  Neck:     Thyroid: No thyromegaly.  Cardiovascular:     Rate and Rhythm: Normal rate and regular rhythm.     Pulses: Normal pulses.     Heart sounds: Normal heart sounds. No murmur heard.   Pulmonary:     Effort: Pulmonary effort is normal. No respiratory distress.     Breath sounds: Normal breath sounds. No wheezing, rhonchi or rales.  Musculoskeletal:     Cervical back: Neck supple.     Right lower leg: No edema.     Left lower leg: No edema.  Lymphadenopathy:     Cervical: No cervical adenopathy.  Skin:    General: Skin is warm and dry.     Findings: No rash.  Neurological:     Mental Status: She is alert and oriented to person, place, and time. Mental status is at baseline.  Psychiatric:        Mood and Affect: Mood normal.        Behavior: Behavior normal.     No results found for any visits on 08/17/19.  Assessment & Plan     Problem List Items Addressed This Visit      Cardiovascular and Mediastinum   Essential hypertension -  Primary    Well controlled Continue current medications Recheck metabolic panel F/u in 8 months       Relevant Orders   Basic Metabolic Panel (BMET)     Nervous and Auditory   Peripheral polyneuropathy    uncontrolled Has seen Dr. Malvin Johns Taking gabapentin and nortriptyline  Patient to follow up with Dr. Malvin Johns Patient does have EMG appointment 6/25      Relevant Medications   nortriptyline (PAMELOR) 10 MG capsule     Other   Class 1 obesity without serious comorbidity with body mass index (BMI) of 32.0 to 32.9 in adult    Discussed  importance of healthy weight management Discussed diet and exercise Referral to [facility]      Relevant Orders   Amb Ref to Medical Weight Management   Adjustment disorder with mixed anxiety and depressed mood    Improved PHQ 9 is 4 Did not tolerate Duloxetine D/C Duloxetine Follow up in 6 months for CPE       Other Visit Diagnoses    Screening for HIV (human immunodeficiency virus)       Relevant Orders   HIV Antibody (routine testing w rflx)   Need for hepatitis C screening test       Relevant Orders   Hepatitis C antibody   Colon cancer screening       Relevant Orders   Cologuard       Return in about 6 months (around 02/16/2020) for CPE.      I, Shirlee Latch, MD, have reviewed all documentation for this visit. The documentation on 08/17/19 for the exam, diagnosis, procedures, and orders are all accurate and complete.   Livie Vanderhoof, Marzella Schlein, MD, MPH [facility]

## 2019-08-17 NOTE — Assessment & Plan Note (Signed)
Improved PHQ 9 is 4 Did not tolerate Duloxetine D/C Duloxetine Follow up in 6 months for CPE

## 2019-08-17 NOTE — Patient Instructions (Signed)

## 2019-08-17 NOTE — Assessment & Plan Note (Addendum)
Discussed importance of healthy weight management Discussed diet and exercise Referral to [facility]

## 2019-08-17 NOTE — Telephone Encounter (Signed)
Requested Prescriptions  Pending Prescriptions Disp Refills   hydrochlorothiazide (HYDRODIURIL) 25 MG tablet [Pharmacy Med Name: hydroCHLOROthiazide 25 MG Oral Tablet] 90 tablet 1    Sig: Take 1 tablet by mouth once daily     Cardiovascular: Diuretics - Thiazide Passed - 08/17/2019  3:48 PM      Passed - Ca in normal range and within 360 days    Calcium  Date Value Ref Range Status  05/15/2019 9.4 8.7 - 10.2 mg/dL Final         Passed - Cr in normal range and within 360 days    Creatinine, Ser  Date Value Ref Range Status  05/15/2019 0.64 0.57 - 1.00 mg/dL Final         Passed - K in normal range and within 360 days    Potassium  Date Value Ref Range Status  05/15/2019 4.4 3.5 - 5.2 mmol/L Final         Passed - Na in normal range and within 360 days    Sodium  Date Value Ref Range Status  05/15/2019 141 134 - 144 mmol/L Final         Passed - Last BP in normal range    BP Readings from Last 1 Encounters:  08/17/19 119/77         Passed - Valid encounter within last 6 months    Recent Outpatient Visits          Today Essential hypertension   [facility] [place], Marzella Schlein, MD   2 months ago Current moderate episode of major depressive disorder without prior episode [facility])    [facility] [place], Marzella Schlein, MD   3 months ago Essential hypertension   [facility] [place], Marzella Schlein, MD   5 months ago Essential hypertension   [facility] [place], Marzella Schlein, MD   7 months ago Peripheral polyneuropathy   [facility], Marzella Schlein, MD

## 2019-08-18 LAB — BASIC METABOLIC PANEL
BUN/Creatinine Ratio: 21 (ref 9–23)
BUN: 15 mg/dL (ref 6–24)
CO2: 24 mmol/L (ref 20–29)
Calcium: 10.4 mg/dL — ABNORMAL HIGH (ref 8.7–10.2)
Chloride: 98 mmol/L (ref 96–106)
Creatinine, Ser: 0.72 mg/dL (ref 0.57–1.00)
GFR calc Af Amer: 111 mL/min/{1.73_m2} (ref >59)
GFR calc non Af Amer: 96 mL/min/{1.73_m2} (ref >59)
Glucose: 100 mg/dL — ABNORMAL HIGH (ref 65–99)
Potassium: 4 mmol/L (ref 3.5–5.2)
Sodium: 141 mmol/L (ref 134–144)

## 2019-08-18 LAB — HEPATITIS C ANTIBODY: Hep C Virus Ab: <0.1 s/co ratio (ref 0.0–0.9)

## 2019-08-18 LAB — HIV ANTIBODY (ROUTINE TESTING W REFLEX): HIV Screen 4th Generation wRfx: NONREACTIVE

## 2019-08-21 ENCOUNTER — Telehealth: Payer: Self-pay

## 2019-08-21 NOTE — Telephone Encounter (Signed)
-----   Message from Margaretann Loveless, [place] sent at 08/21/2019 12:54 PM EDT ----- Kidney function is normal. Sodium and potassium are normal. Calcium is borderline high again at 10.4, this can be physiologically normal as normal cutoff is 10.2. Sugar is ok. HIV screen is negative.

## 2019-08-21 NOTE — Telephone Encounter (Signed)
Patient was advised and states that she will work on lowing her calcium levels again.

## 2019-09-18 ENCOUNTER — Encounter: Payer: Self-pay | Admitting: Emergency Medicine

## 2019-09-18 ENCOUNTER — Emergency Department: Payer: 59

## 2019-09-18 ENCOUNTER — Other Ambulatory Visit: Payer: Self-pay

## 2019-09-18 DIAGNOSIS — Z20822 Contact with and (suspected) exposure to covid-19: Secondary | ICD-10-CM | POA: Insufficient documentation

## 2019-09-18 DIAGNOSIS — I10 Essential (primary) hypertension: Secondary | ICD-10-CM | POA: Insufficient documentation

## 2019-09-18 DIAGNOSIS — R0602 Shortness of breath: Secondary | ICD-10-CM | POA: Diagnosis not present

## 2019-09-18 DIAGNOSIS — J069 Acute upper respiratory infection, unspecified: Secondary | ICD-10-CM | POA: Diagnosis not present

## 2019-09-18 DIAGNOSIS — R509 Fever, unspecified: Secondary | ICD-10-CM | POA: Diagnosis present

## 2019-09-18 LAB — CBC WITH DIFFERENTIAL/PLATELET
Abs Immature Granulocytes: 0.03 10*3/uL (ref 0.00–0.07)
Basophils Absolute: 0 10*3/uL (ref 0.0–0.1)
Basophils Relative: 0 %
Eosinophils Absolute: 0.5 10*3/uL (ref 0.0–0.5)
Eosinophils Relative: 4 %
HCT: 41.2 % (ref 36.0–46.0)
Hemoglobin: 13.9 g/dL (ref 12.0–15.0)
Immature Granulocytes: 0 %
Lymphocytes Relative: 38 %
Lymphs Abs: 4.1 10*3/uL — ABNORMAL HIGH (ref 0.7–4.0)
MCH: 26.8 pg (ref 26.0–34.0)
MCHC: 33.7 g/dL (ref 30.0–36.0)
MCV: 79.5 fL — ABNORMAL LOW (ref 80.0–100.0)
Monocytes Absolute: 0.6 10*3/uL (ref 0.1–1.0)
Monocytes Relative: 6 %
Neutro Abs: 5.6 10*3/uL (ref 1.7–7.7)
Neutrophils Relative %: 52 %
Platelets: 463 10*3/uL — ABNORMAL HIGH (ref 150–400)
RBC: 5.18 MIL/uL — ABNORMAL HIGH (ref 3.87–5.11)
RDW: 13.6 % (ref 11.5–15.5)
WBC: 10.8 10*3/uL — ABNORMAL HIGH (ref 4.0–10.5)
nRBC: 0 % (ref 0.0–0.2)

## 2019-09-18 LAB — COMPREHENSIVE METABOLIC PANEL
ALT: 33 U/L (ref 0–44)
AST: 27 U/L (ref 15–41)
Albumin: 4.2 g/dL (ref 3.5–5.0)
Alkaline Phosphatase: 85 U/L (ref 38–126)
Anion gap: 12 (ref 5–15)
BUN: 22 mg/dL — ABNORMAL HIGH (ref 6–20)
CO2: 24 mmol/L (ref 22–32)
Calcium: 9.2 mg/dL (ref 8.9–10.3)
Chloride: 101 mmol/L (ref 98–111)
Creatinine, Ser: 0.65 mg/dL (ref 0.44–1.00)
GFR calc Af Amer: >60 mL/min (ref >60)
GFR calc non Af Amer: >60 mL/min (ref >60)
Glucose, Bld: 117 mg/dL — ABNORMAL HIGH (ref 70–99)
Potassium: 3.6 mmol/L (ref 3.5–5.1)
Sodium: 137 mmol/L (ref 135–145)
Total Bilirubin: 0.5 mg/dL (ref 0.3–1.2)
Total Protein: 8.2 g/dL — ABNORMAL HIGH (ref 6.5–8.1)

## 2019-09-18 LAB — TROPONIN I (HIGH SENSITIVITY): Troponin I (High Sensitivity): 5 ng/L (ref <18)

## 2019-09-18 NOTE — ED Triage Notes (Signed)
Pt presents to ED with fever, congestion, and productive cough since Wednesday. Yesterday pt started to feel short of breath even at rest.

## 2019-09-19 ENCOUNTER — Emergency Department
Admission: EM | Admit: 2019-09-19 | Discharge: 2019-09-19 | Disposition: A | Discharge status: Home / Self Care | Payer: 59 | Attending: Emergency Medicine | Admitting: Emergency Medicine

## 2019-09-19 ENCOUNTER — Emergency Department: Payer: 59

## 2019-09-19 DIAGNOSIS — R059 Cough, unspecified: Secondary | ICD-10-CM

## 2019-09-19 DIAGNOSIS — J069 Acute upper respiratory infection, unspecified: Secondary | ICD-10-CM

## 2019-09-19 HISTORY — DX: Polyneuropathy, unspecified: G62.9

## 2019-09-19 LAB — SARS CORONAVIRUS 2 BY RT PCR (HOSPITAL ORDER, PERFORMED IN ~~LOC~~ HOSPITAL LAB): SARS Coronavirus 2: NEGATIVE

## 2019-09-19 LAB — BRAIN NATRIURETIC PEPTIDE: B Natriuretic Peptide: 24.6 pg/mL (ref 0.0–100.0)

## 2019-09-19 LAB — TROPONIN I (HIGH SENSITIVITY): Troponin I (High Sensitivity): 4 ng/L (ref <18)

## 2019-09-19 LAB — FIBRIN DERIVATIVES D-DIMER (ARMC ONLY): Fibrin derivatives D-dimer (ARMC): 625.26 ng/mL (FEU) — ABNORMAL HIGH (ref 0.00–499.00)

## 2019-09-19 MED ORDER — SODIUM CHLORIDE 0.9 % IV BOLUS
1000.0000 mL | Freq: Once | INTRAVENOUS | Status: AC
  Administered 2019-09-19: 1000 mL via INTRAVENOUS

## 2019-09-19 MED ORDER — IOHEXOL 350 MG/ML SOLN
75.0000 mL | Freq: Once | INTRAVENOUS | Status: AC | PRN
  Administered 2019-09-19: 75 mL via INTRAVENOUS

## 2019-09-19 MED ORDER — HYDROCOD POLST-CPM POLST ER 10-8 MG/5ML PO SUER
5.0000 mL | Freq: Once | ORAL | Status: AC
  Administered 2019-09-19: 5 mL via ORAL
  Filled 2019-09-19: qty 5

## 2019-09-19 MED ORDER — ALBUTEROL SULFATE (2.5 MG/3ML) 0.083% IN NEBU
2.5000 mg | INHALATION_SOLUTION | Freq: Once | RESPIRATORY_TRACT | Status: AC
  Administered 2019-09-19: 2.5 mg via RESPIRATORY_TRACT
  Filled 2019-09-19: qty 3

## 2019-09-19 MED ORDER — PREDNISONE 20 MG PO TABS: 40.0000 mg | ORAL_TABLET | Freq: Every day | ORAL | 0 refills | Status: AC

## 2019-09-19 MED ORDER — HYDROCOD POLST-CPM POLST ER 10-8 MG/5ML PO SUER: 5.0000 mL | Freq: Every evening | ORAL | 0 refills | Status: DC | PRN

## 2019-09-19 MED ORDER — PREDNISONE 20 MG PO TABS
60.0000 mg | ORAL_TABLET | Freq: Once | ORAL | Status: AC
  Administered 2019-09-19: 60 mg via ORAL
  Filled 2019-09-19: qty 3

## 2019-09-19 MED ORDER — ALBUTEROL SULFATE HFA 108 (90 BASE) MCG/ACT IN AERS
2.0000 | INHALATION_SPRAY | Freq: Four times a day (QID) | RESPIRATORY_TRACT | 0 refills | Status: DC | PRN
Start: 2019-09-19 — End: 2021-05-26

## 2019-09-19 NOTE — ED Provider Notes (Signed)
[facility] Emergency Department Provider Note   ____________________________________________   First MD Initiated Contact with Patient 09/19/19 0501     (approximate)  I have reviewed the triage vital signs and the nursing notes.   HISTORY  Chief Complaint Nasal Congestion, Shortness of Breath, and Cough    HPI Crystal Nixon is a 54 y.o. female who presents to the ED from home with a chief complaint of fever, congestion, productive cough and shortness of breath.  Symptoms x5 to 6 days.  Reports fever of 101 F; has tried multiple OTC medications without relief of symptoms.  Denies associated chest pain, abdominal pain, nausea, vomiting or diarrhea.  Patient had COVID-19 1 year ago.  States she had double pneumonia and was on oxygen but never hospitalized.  Has not had her COVID-19 vaccinations.       Past Medical History:  Diagnosis Date  . COVID-19 virus infection   . Neuropathy     Patient Active Problem List   Diagnosis Date Noted  . Adjustment disorder with mixed anxiety and depressed mood 06/04/2019  . Current moderate episode of major depressive disorder without prior episode (HCC) 06/04/2019  . Grief 05/15/2019  . Peripheral polyneuropathy 01/13/2019  . Class 1 obesity without serious comorbidity with body mass index (BMI) of 32.0 to 32.9 in adult 01/13/2019  . Essential hypertension 01/13/2019  . Family history of breast cancer 01/13/2019  . Vasomotor symptoms due to menopause 01/13/2019  . ALLERGIC RHINITIS 12/22/2009    Past Surgical History:  Procedure Laterality Date  . ABDOMINAL HYSTERECTOMY    . APPENDECTOMY      Prior to Admission medications   Medication Sig Start Date End Date Taking? Authorizing Provider  DULoxetine (CYMBALTA) 30 MG capsule Take 1 capsule (30 mg total) by mouth daily. Patient not taking: Reported on 08/17/2019 06/02/19   Erasmo Downer, MD  gabapentin (NEURONTIN) 300 MG capsule Take 2  capsules (600 mg total) by mouth 3 (three) times daily. 08/17/19   Margaretann Loveless, PA-C  hydrochlorothiazide (HYDRODIURIL) 25 MG tablet Take 1 tablet by mouth once daily 08/17/19   Bacigalupo, Marzella Schlein, MD  nortriptyline (PAMELOR) 10 MG capsule START WITH 1 CAPSULE BY MOUTH NIGHTLY FOR ONE WEEK THEN INCREASE TO 2 CAPSULES NIGHTLY 07/07/19   [provider]    Allergies Codeine  Family History  Problem Relation Age of Onset  . Breast cancer Mother 71  . ALS Mother   . Stroke Father   . Breast cancer Maternal Grandmother        lived to 61  . Colon cancer Neg Hx     Social History Social History   Tobacco Use  . Smoking status: Never Smoker  . Smokeless tobacco: Never Used  Vaping Use  . Vaping Use: Never used  Substance Use Topics  . Alcohol use: Never  . Drug use: Never    Review of Systems  Constitutional: Positive for fever. Eyes: No visual changes. ENT: Positive for nasal congestion.  No sore throat. Cardiovascular: Denies chest pain. Respiratory: Positive for cough and shortness of breath. Gastrointestinal: No abdominal pain.  No nausea, no vomiting.  No diarrhea.  No constipation. Genitourinary: Negative for dysuria. Musculoskeletal: Negative for back pain. Skin: Negative for rash. Neurological: Negative for headaches, focal weakness or numbness.   ____________________________________________   PHYSICAL EXAM:  VITAL SIGNS: ED Triage Vitals  Enc Vitals Group     BP 09/18/19 2205 (!) 139/93  Pulse Rate 09/18/19 2205 91     Resp 09/18/19 2205 20     Temp 09/18/19 2205 98.2 F (36.8 C)     Temp Source 09/18/19 2205 Oral     SpO2 09/18/19 2205 100 %     Weight 09/18/19 2206 170 lb (77.1 kg)     Height 09/18/19 2206 5\' 2"  (1.575 m)     Head Circumference --      Peak Flow --      Pain Score 09/18/19 2206 0     Pain Loc --      Pain Edu? --      Excl. in GC? --     Constitutional: Alert and oriented. Well appearing and in no acute  distress. Eyes: Conjunctivae are normal. PERRL. EOMI. Head: Atraumatic. Ears: Bilateral TMs dull. Nose: Congestion/rhinnorhea. Mouth/Throat: Mucous membranes are moist.  Oropharynx mildly erythematous without tonsillar swelling, exudates or peritonsillar abscess.  Mildly hoarse voice.  There is no muffled voice or drooling. Neck: No stridor.   Cardiovascular: Normal rate, regular rhythm. Grossly normal heart sounds.  Good peripheral circulation. Respiratory: Normal respiratory effort.  No retractions. Lungs CTAB. Gastrointestinal: Soft and nontender. No distention. No abdominal bruits. No CVA tenderness. Musculoskeletal: No lower extremity tenderness nor edema.  No joint effusions. Neurologic:  Normal speech and language. No gross focal neurologic deficits are appreciated. No gait instability. Skin:  Skin is warm, dry and intact. No rash noted.  No petechiae. Psychiatric: Mood and affect are normal. Speech and behavior are normal.  ____________________________________________   LABS (all labs ordered are listed, but only abnormal results are displayed)  Labs Reviewed  COMPREHENSIVE METABOLIC PANEL - Abnormal; Notable for the following components:      Result Value   Glucose, Bld 117 (*)    BUN 22 (*)    Total Protein 8.2 (*)    All other components within normal limits  CBC WITH DIFFERENTIAL/PLATELET - Abnormal; Notable for the following components:   WBC 10.8 (*)    RBC 5.18 (*)    MCV 79.5 (*)    Platelets 463 (*)    Lymphs Abs 4.1 (*)    All other components within normal limits  SARS CORONAVIRUS 2 BY RT PCR (HOSPITAL ORDER, PERFORMED IN [facility])  BRAIN NATRIURETIC PEPTIDE  FIBRIN DERIVATIVES D-DIMER ([facility] ONLY)  TROPONIN I (HIGH SENSITIVITY)  TROPONIN I (HIGH SENSITIVITY)   ____________________________________________  EKG  ED ECG REPORT I, Aime Carreras J, the attending physician, personally viewed and interpreted this ECG.   Date: 09/19/2019  EKG  Time: 2204  Rate: 81  Rhythm: normal EKG, normal sinus rhythm  Axis: Normal  Intervals:none  ST&T Change: Nonspecific  ____________________________________________  RADIOLOGY  ED MD interpretation: Pulmonary interstitial changes but no acute cardiopulmonary process  Official radiology report(s): DG Chest 2 View  Result Date: 09/18/2019 CLINICAL DATA:  54 year old female with shortness of breath and productive cough since last week. EXAM: CHEST - 2 VIEW COMPARISON:  Portable chest 09/23/2018 and earlier. FINDINGS: Lung volumes and mediastinal contours remain normal. Visualized tracheal air column is within normal limits. No pneumothorax, pulmonary edema, pleural effusion or consolidation. But increased coarse bilateral interstitial opacity since 2012 radiographs, although not significantly changed from 2020. No definite acute pulmonary opacity. No acute osseous abnormality identified. Negative visible bowel gas pattern. IMPRESSION: Progressed pulmonary interstitial changes since 2012 but no acute cardiopulmonary abnormality. Electronically Signed   By: 2013 M.D.   On: 09/18/2019 22:38    ____________________________________________  PROCEDURES  Procedure(s) performed (including Critical Care):  Procedures   ____________________________________________   INITIAL IMPRESSION / ASSESSMENT AND PLAN / ED COURSE  As part of my medical decision making, I reviewed the following data within the electronic MEDICAL RECORD NUMBER Nursing notes reviewed and incorporated, Labs reviewed, EKG interpreted, Old chart reviewed, Radiograph reviewed and Notes from prior ED visits     Crystal Nixon was evaluated in Emergency Department on 09/19/2019 for the symptoms described in the history of present illness. She was evaluated in the context of the global COVID-19 pandemic, which necessitated consideration that the patient might be at risk for infection with the SARS-CoV-2 virus that causes  COVID-19. Institutional protocols and algorithms that pertain to the evaluation of patients at risk for COVID-19 are in a state of rapid change based on information released by regulatory bodies including the CDC and federal and state organizations. These policies and algorithms were followed during the patient's care in the ED.    54 year old female presenting with fever, cough, shortness of breath. Differential includes, but is not limited to, viral syndrome, bronchitis including COPD exacerbation, pneumonia, reactive airway disease including asthma, CHF including exacerbation with or without pulmonary/interstitial edema, pneumothorax, ACS, thoracic trauma, and pulmonary embolism.  Laboratory results unremarkable.  Will check repeat troponin, BNP, D-dimer.  Administer Tussionex, albuterol inhaler, prednisone.  Will reassess.    Clinical Course as of Sep 18 717  Tue Sep 19, 2019  0718 Care transferred to Dr. Roxan Hockey at change of shift pending lab and Covid results. If unremarkable, anticipate patient may be discharged home.   [JS]    Clinical Course User Index [JS] Irean Hong, MD     ____________________________________________   FINAL CLINICAL IMPRESSION(S) / ED DIAGNOSES  Final diagnoses:  Viral URI with cough     ED Discharge Orders    None       Note:  This document was prepared using Dragon voice recognition software and may include unintentional dictation errors.   Irean Hong, MD 09/19/19 [phone]

## 2019-09-19 NOTE — ED Provider Notes (Signed)
Patient received in signout from Dr. Megan Salon pending follow-up CTA.  CTA is fortunately reassuring.  Patient did have improvement after albuterol.  Suspect a component of bronchitis. Her bedside states does have some secondhand smoke exposure.  Covid is negative.  No sign of infectious process.  Will treat for bronchitis.   Willy Eddy, MD 09/19/19 1113

## 2020-01-25 ENCOUNTER — Telehealth: Payer: Self-pay | Admitting: Family Medicine

## 2020-01-25 NOTE — Telephone Encounter (Signed)
Patient left her Long Term Disability form for Dr. Leonard Schwartz. To fill out. She said she left this back in August 2021 and it has not been done. She also left the envelope for the form to be mailed once completed.  Please call [place] once completed and mailed at [phone]. Form was placed in Dr. Senaida Lange box.  Thanks, [employer]

## 2020-02-02 ENCOUNTER — Other Ambulatory Visit: Payer: Self-pay | Admitting: Physician Assistant

## 2020-02-05 NOTE — Telephone Encounter (Signed)
Pt called for update on form request

## 2020-02-07 NOTE — Telephone Encounter (Signed)
Forms faxed to [employer] [phone]. Can not mail form without pt signing release. TNP

## 2020-02-07 NOTE — Telephone Encounter (Signed)
Form completed and will give to Rosey Bath to submit today.  Please let the patient know that they have been completed.  As we discussed in August when they were dropped off previously, she does need to have her disability and restrictions certified by neurology who is managing this condition.  This was stated on the form.

## 2020-02-07 NOTE — Telephone Encounter (Signed)
Please advise 

## 2020-02-20 ENCOUNTER — Other Ambulatory Visit: Payer: Self-pay

## 2020-02-20 ENCOUNTER — Encounter: Payer: Self-pay | Admitting: Family Medicine

## 2020-02-20 ENCOUNTER — Ambulatory Visit (INDEPENDENT_AMBULATORY_CARE_PROVIDER_SITE_OTHER): Payer: Self-pay | Admitting: Family Medicine

## 2020-02-20 VITALS — BP 124/84 | HR 90 | Temp 98.5°F | Wt 168.0 lb

## 2020-02-20 DIAGNOSIS — L0291 Cutaneous abscess, unspecified: Secondary | ICD-10-CM

## 2020-02-20 DIAGNOSIS — R1011 Right upper quadrant pain: Secondary | ICD-10-CM

## 2020-02-20 MED ORDER — DOXYCYCLINE HYCLATE 100 MG PO TABS
100.0000 mg | ORAL_TABLET | Freq: Two times a day (BID) | ORAL | 0 refills | Status: AC
Start: 2020-02-20 — End: 2020-02-27

## 2020-02-20 NOTE — Progress Notes (Signed)
Established patient visit   Patient: Crystal Nixon   DOB: 04-27-1965   54 y.o. Female  MRN: [id number] Visit Date: 02/20/2020  Today's healthcare provider: Shirlee Latch, MD   Chief Complaint  Patient presents with  . Abdominal Pain   Subjective    HPI  Abdominal Pain  She reports chronic abdominal pain. The most recent episode started  and is staying constant. The abdominal pain is located in the right side with radiation to the back. It is described as dull and stabbing, is moderate in intensity, occurring constant dull pain with occasional sharpe pain. It is aggravated by moving and is relieved by nothing. She has tried acetaminophen and antacids with no relief.  Associated symptoms: Yes anorexia  Yes belching  No bloody stool No blood in urine   No constipation No diarrhea  No dysuria No fever  No flatus Yes headaches  Yes headaches No joint pains  No myalgias Yes nausea  No vomiting Yes weight loss      Previous labs Lab Results  Component Value Date   WBC 10.8 (H) 09/18/2019   HGB 13.9 09/18/2019   HCT 41.2 09/18/2019   MCV 79.5 (L) 09/18/2019   MCH 26.8 09/18/2019   RDW 13.6 09/18/2019   PLT 463 (H) 09/18/2019   Lab Results  Component Value Date   GLUCOSE 117 (H) 09/18/2019   NA 137 09/18/2019   K 3.6 09/18/2019   CL 101 09/18/2019   CO2 24 09/18/2019   BUN 22 (H) 09/18/2019   CREATININE 0.65 09/18/2019   GFRNONAA >60 09/18/2019   GFRAA >60 09/18/2019   CALCIUM 9.2 09/18/2019   PROT 8.2 (H) 09/18/2019   ALBUMIN 4.2 09/18/2019   LABGLOB 3.0 05/15/2019   AGRATIO 1.4 05/15/2019   BILITOT 0.5 09/18/2019   ALKPHOS 85 09/18/2019   AST 27 09/18/2019   ALT 33 09/18/2019   ANIONGAP 12 09/18/2019   No results found for: AMYLASE -----------------------------------------------------------------------------------------  Patient Active Problem List   Diagnosis Date Noted  . Adjustment disorder with mixed anxiety and depressed mood  06/04/2019  . Current moderate episode of major depressive disorder without prior episode (HCC) 06/04/2019  . Grief 05/15/2019  . Peripheral polyneuropathy 01/13/2019  . Class 1 obesity without serious comorbidity with body mass index (BMI) of 32.0 to 32.9 in adult 01/13/2019  . Essential hypertension 01/13/2019  . Family history of breast cancer 01/13/2019  . Vasomotor symptoms due to menopause 01/13/2019  . ALLERGIC RHINITIS 12/22/2009   Past Medical History:  Diagnosis Date  . COVID-19 virus infection   . Neuropathy    Social History   Tobacco Use  . Smoking status: Never Smoker  . Smokeless tobacco: Never Used  Vaping Use  . Vaping Use: Never used  Substance Use Topics  . Alcohol use: Never  . Drug use: Never   No Known Allergies   Medications: Outpatient Medications Prior to Visit  Medication Sig  . gabapentin (NEURONTIN) 300 MG capsule TAKE 2 CAPSULES BY MOUTH THREE TIMES DAILY  . hydrochlorothiazide (HYDRODIURIL) 25 MG tablet Take 1 tablet by mouth once daily  . nortriptyline (PAMELOR) 10 MG capsule START WITH 1 CAPSULE BY MOUTH NIGHTLY FOR ONE WEEK THEN INCREASE TO 2 CAPSULES NIGHTLY  . albuterol (VENTOLIN HFA) 108 (90 Base) MCG/ACT inhaler Inhale 2 puffs into the lungs every 6 (six) hours as needed for wheezing or shortness of breath. (Patient not taking: Reported on 02/20/2020)  . chlorpheniramine-HYDROcodone [facility])  10-8 MG/5ML SUER Take 5 mLs by mouth at bedtime as needed for cough.  . DULoxetine (CYMBALTA) 30 MG capsule Take 1 capsule (30 mg total) by mouth daily.   No facility-administered medications prior to visit.    Review of Systems  Constitutional: Positive for appetite change and fatigue. Negative for activity change, chills, diaphoresis, fever and unexpected weight change.  Respiratory: Negative.   Cardiovascular: Positive for leg swelling. Negative for chest pain and palpitations.  Gastrointestinal: Positive for abdominal pain and  nausea. Negative for abdominal distention, anal bleeding, blood in stool, constipation, diarrhea, rectal pain and vomiting.  Neurological: Positive for headaches. Negative for dizziness and light-headedness.      Objective    BP 124/84 (BP Location: Right Arm, Patient Position: Sitting, Cuff Size: Large)   Pulse 90   Temp 98.5 F (36.9 C) (Oral)   Wt 168 lb (76.2 kg)   BMI 30.73 kg/m    Physical Exam Constitutional:      General: She is not in acute distress.    Appearance: She is well-developed. She is not diaphoretic.  HENT:     Head: Normocephalic and atraumatic.  Cardiovascular:     Rate and Rhythm: Normal rate and regular rhythm.  Pulmonary:     Effort: Pulmonary effort is normal. No respiratory distress.  Abdominal:     Palpations: Abdomen is soft. There is no fluid wave or mass.     Tenderness: There is abdominal tenderness in the right upper quadrant. There is no right CVA tenderness, left CVA tenderness, guarding or rebound. Positive signs include Murphy's sign.  Skin:    General: Skin is warm and dry.     Comments: +abscess with induration but no fluctuance of R inner thigh  Neurological:     Mental Status: She is alert.       No results found for any visits on 02/20/20.  Assessment & Plan     1. RUQ pain - new problem xfew months - chronic aching with intermittent episodes of sharp, stabbing RUQ pain that lasts for hours - given exam and history findings, concern for possible gallbladder pathology such as cholelithiasis, chronic cholecystitis, etc - will obtain RUQ [place] to evaluate gallbladder further - consider LFTs and lipase as well - US ABDOMEN LIMITED RUQ (LIVER/GB); Future  2. Abscess - new problem - no fluctuance or indication for I&D -  Advised warm compresses - 7d course of doxycycline - return precautions discussed  Meds ordered this encounter  Medications  . doxycycline (VIBRA-TABS) 100 MG tablet    Sig: Take 1 tablet (100 mg total) by  mouth 2 (two) times daily for 7 days.    Dispense:  14 tablet    Refill:  0     Return if symptoms worsen or fail to improve.      I, Shirlee Latch, MD, have reviewed all documentation for this visit. The documentation on 02/20/20 for the exam, diagnosis, procedures, and orders are all accurate and complete.   Barb Shear, Marzella Schlein, MD, MPH [facility]

## 2020-02-20 NOTE — Patient Instructions (Signed)

## 2020-02-27 ENCOUNTER — Ambulatory Visit: Payer: Self-pay

## 2020-02-28 ENCOUNTER — Telehealth: Payer: Self-pay

## 2020-02-28 NOTE — Telephone Encounter (Signed)
Tried to contact [employer] to advise that [employer] processed their request yesterday. They should receive records via mail in next 10 to 14 days. No answer with [employer] and unable to LM. Thanks TNP

## 2020-02-28 NOTE — Telephone Encounter (Signed)
Copied from CRM [id number]. Topic: Medical Record Request - Other >> Feb 28, 2020  1:52 PM Elliot Gault wrote: Patient Name/DOB/MRN #:  Crystal Nixon  Requestor Name/Agency:  Elby Showers from [facility] Call Back #:  [phone]  Information Requested: Checking on the medical release form faxed over 02/19/2020

## 2020-05-11 ENCOUNTER — Emergency Department: Payer: Self-pay

## 2020-05-11 ENCOUNTER — Encounter: Payer: Self-pay | Admitting: Emergency Medicine

## 2020-05-11 ENCOUNTER — Emergency Department
Admission: EM | Admit: 2020-05-11 | Discharge: 2020-05-11 | Disposition: A | Discharge status: Home / Self Care | Payer: Self-pay | Attending: Emergency Medicine | Admitting: Emergency Medicine

## 2020-05-11 DIAGNOSIS — I10 Essential (primary) hypertension: Secondary | ICD-10-CM | POA: Insufficient documentation

## 2020-05-11 DIAGNOSIS — X58XXXA Exposure to other specified factors, initial encounter: Secondary | ICD-10-CM | POA: Insufficient documentation

## 2020-05-11 DIAGNOSIS — Z8616 Personal history of COVID-19: Secondary | ICD-10-CM | POA: Insufficient documentation

## 2020-05-11 DIAGNOSIS — Z79899 Other long term (current) drug therapy: Secondary | ICD-10-CM | POA: Insufficient documentation

## 2020-05-11 DIAGNOSIS — T189XXA Foreign body of alimentary tract, part unspecified, initial encounter: Secondary | ICD-10-CM | POA: Insufficient documentation

## 2020-05-11 MED ORDER — ALUM & MAG HYDROXIDE-SIMETH 200-200-20 MG/5ML PO SUSP
30.0000 mL | Freq: Once | ORAL | Status: AC
  Administered 2020-05-11: 30 mL via ORAL
  Filled 2020-05-11: qty 30

## 2020-05-11 MED ORDER — LIDOCAINE VISCOUS HCL 2 % MT SOLN
15.0000 mL | Freq: Once | OROMUCOSAL | Status: AC
  Administered 2020-05-11: 15 mL via ORAL
  Filled 2020-05-11: qty 15

## 2020-05-11 NOTE — ED Provider Notes (Signed)
[facility] Emergency Department Provider Note  Time seen: 11:30 PM  I have reviewed the triage vital signs and the nursing notes.   HISTORY  Chief Complaint Swallowed Foreign Body   HPI Danielle Renesmay Nesbitt is a 55 y.o. female with a past medical history of neuropathy, hypertension, presents to the emergency department for accidental swallowed foreign body.  According to the patient she was eating lobster when she swallowed she felt a sharp piece of the lobster tail.  States shortly afterward she vomited.  States when she tried to drink something she felt pain in her throat and vomited again so she came to the emergency department for evaluation.  Patient states she was very nervous and scared.   Past Medical History:  Diagnosis Date  . COVID-19 virus infection   . Neuropathy     Patient Active Problem List   Diagnosis Date Noted  . Adjustment disorder with mixed anxiety and depressed mood 06/04/2019  . Current moderate episode of major depressive disorder without prior episode (HCC) 06/04/2019  . Grief 05/15/2019  . Peripheral polyneuropathy 01/13/2019  . Class 1 obesity without serious comorbidity with body mass index (BMI) of 32.0 to 32.9 in adult 01/13/2019  . Essential hypertension 01/13/2019  . Family history of breast cancer 01/13/2019  . Vasomotor symptoms due to menopause 01/13/2019  . ALLERGIC RHINITIS 12/22/2009    Past Surgical History:  Procedure Laterality Date  . ABDOMINAL HYSTERECTOMY    . APPENDECTOMY      Prior to Admission medications   Medication Sig Start Date End Date Taking? Authorizing Provider  albuterol (VENTOLIN HFA) 108 (90 Base) MCG/ACT inhaler Inhale 2 puffs into the lungs every 6 (six) hours as needed for wheezing or shortness of breath. Patient not taking: Reported on 02/20/2020 09/19/19   Willy Eddy, MD  gabapentin (NEURONTIN) 300 MG capsule TAKE 2 CAPSULES BY MOUTH THREE TIMES DAILY 02/02/20   Erasmo Downer, MD  hydrochlorothiazide (HYDRODIURIL) 25 MG tablet Take 1 tablet by mouth once daily 08/17/19   Erasmo Downer, MD  nortriptyline (PAMELOR) 10 MG capsule START WITH 1 CAPSULE BY MOUTH NIGHTLY FOR ONE WEEK THEN INCREASE TO 2 CAPSULES NIGHTLY 07/07/19   [provider]    No Known Allergies  Family History  Problem Relation Age of Onset  . Breast cancer Mother 79  . ALS Mother   . Stroke Father   . Breast cancer Maternal Grandmother        lived to 66  . Colon cancer Neg Hx     Social History Social History   Tobacco Use  . Smoking status: Never Smoker  . Smokeless tobacco: Never Used  Vaping Use  . Vaping Use: Never used  Substance Use Topics  . Alcohol use: Never  . Drug use: Never    Review of Systems Constitutional: Negative for fever. ENT: Feels like something could be stuck in her throat. Cardiovascular: Negative for chest pain. Respiratory: Negative for shortness of breath. Gastrointestinal: Negative for abdominal pain positive for nausea vomiting, now resolved All other ROS negative  ____________________________________________   PHYSICAL EXAM:  VITAL SIGNS: ED Triage Vitals [05/11/20 2131]  Enc Vitals Group     BP (!) 155/97     Pulse Rate (!) 102     Resp (!) 21     Temp 97.8 F (36.6 C)     Temp Source Oral     SpO2 97 %     Weight  Height      Head Circumference      Peak Flow      Pain Score      Pain Loc      Pain Edu?      Excl. in GC?    Constitutional: Alert and oriented. Well appearing and in no distress. Eyes: Normal exam ENT      Head: Normocephalic and atraumatic.      Mouth/Throat: Mucous membranes are moist. Cardiovascular: Normal rate, regular rhythm.  Respiratory: Normal respiratory effort without tachypnea nor retractions. Breath sounds are clear  Gastrointestinal: Soft and nontender. No distention.  Musculoskeletal: Nontender with normal range of motion in all extremities.  Neurologic:  Normal  speech and language. No gross focal neurologic deficits  Skin:  Skin is warm, dry and intact.  Psychiatric: Mood and affect are normal.  ____________________________________________   RADIOLOGY  X-rays negative for radiopaque foreign body  ____________________________________________   INITIAL IMPRESSION / ASSESSMENT AND PLAN / ED COURSE  Pertinent labs & imaging results that were available during my care of the patient were reviewed by me and considered in my medical decision making (see chart for details).   Patient presents to the emergency department for possible swallowed foreign body.  Patient states she swallowed a piece of lobster tail and she believes.  Patient states she did vomit several times after swallowing.  Has not tried drinking since getting to the emergency department.  X-ray showed no radiopaque foreign body.  I gave the patient a GI cocktail for possible esophageal abrasion.  After the GI cocktail patient was able to drink an entire glass of water without difficulty or issue.  Patient does state there is a slight discomfort in the area of her neck but no worse with swallowing.  Possible esophageal abrasion.  I did offer to proceed with CT imaging of the neck to rule out foreign body.  Patient however wishes to hold off at this time which I believe is reasonable as the patient is able to drink without issue.  I discussed return precautions.  Patient agreeable plan of care.  Chavela Deshay Kirstein was evaluated in Emergency Department on 05/11/2020 for the symptoms described in the history of present illness. She was evaluated in the context of the global COVID-19 pandemic, which necessitated consideration that the patient might be at risk for infection with the SARS-CoV-2 virus that causes COVID-19. Institutional protocols and algorithms that pertain to the evaluation of patients at risk for COVID-19 are in a state of rapid change based on information released by regulatory  bodies including the CDC and federal and state organizations. These policies and algorithms were followed during the patient's care in the ED.  ____________________________________________   FINAL CLINICAL IMPRESSION(S) / ED DIAGNOSES  Swallowed foreign body   Minna Antis, MD 05/11/20 2333

## 2020-05-11 NOTE — Discharge Instructions (Addendum)
As we discussed please return to the emergency department for any worsening pain, vomiting, inability to swallow, or any other symptom personally concerning to yourself.

## 2020-05-11 NOTE — ED Notes (Addendum)
Pt states she started choking on lobster and crab shell about 20 minutes ago. Pt coughing occasionally but able to speak in full sentences. No object visible in the airway. Pt reports she threw up a couple times.

## 2020-05-11 NOTE — ED Triage Notes (Signed)
Pt reports swallowing a sharp piece of lobster, "spiny part," approx 15 prior to arrival. Pt 2 episodes of emesis since. Pt coughing and gagging in triage room. Pt able to maintain airway at this time. Pt reports swallowing makes her gag and cough. Prior to arrival, pt attempted to consume warm coke with no success due to emesis.

## 2020-07-01 ENCOUNTER — Other Ambulatory Visit: Payer: Self-pay | Admitting: Family Medicine

## 2020-09-29 IMAGING — CR DG CHEST 2V
2 series · 2 of 2 positions shown · non-contrast
Comparison: Portable chest 09/23/2018 and earlier.

CLINICAL DATA: 54-year-old female with shortness of breath and
productive cough since last week.

EXAM:
CHEST - 2 VIEW

[chest pa]
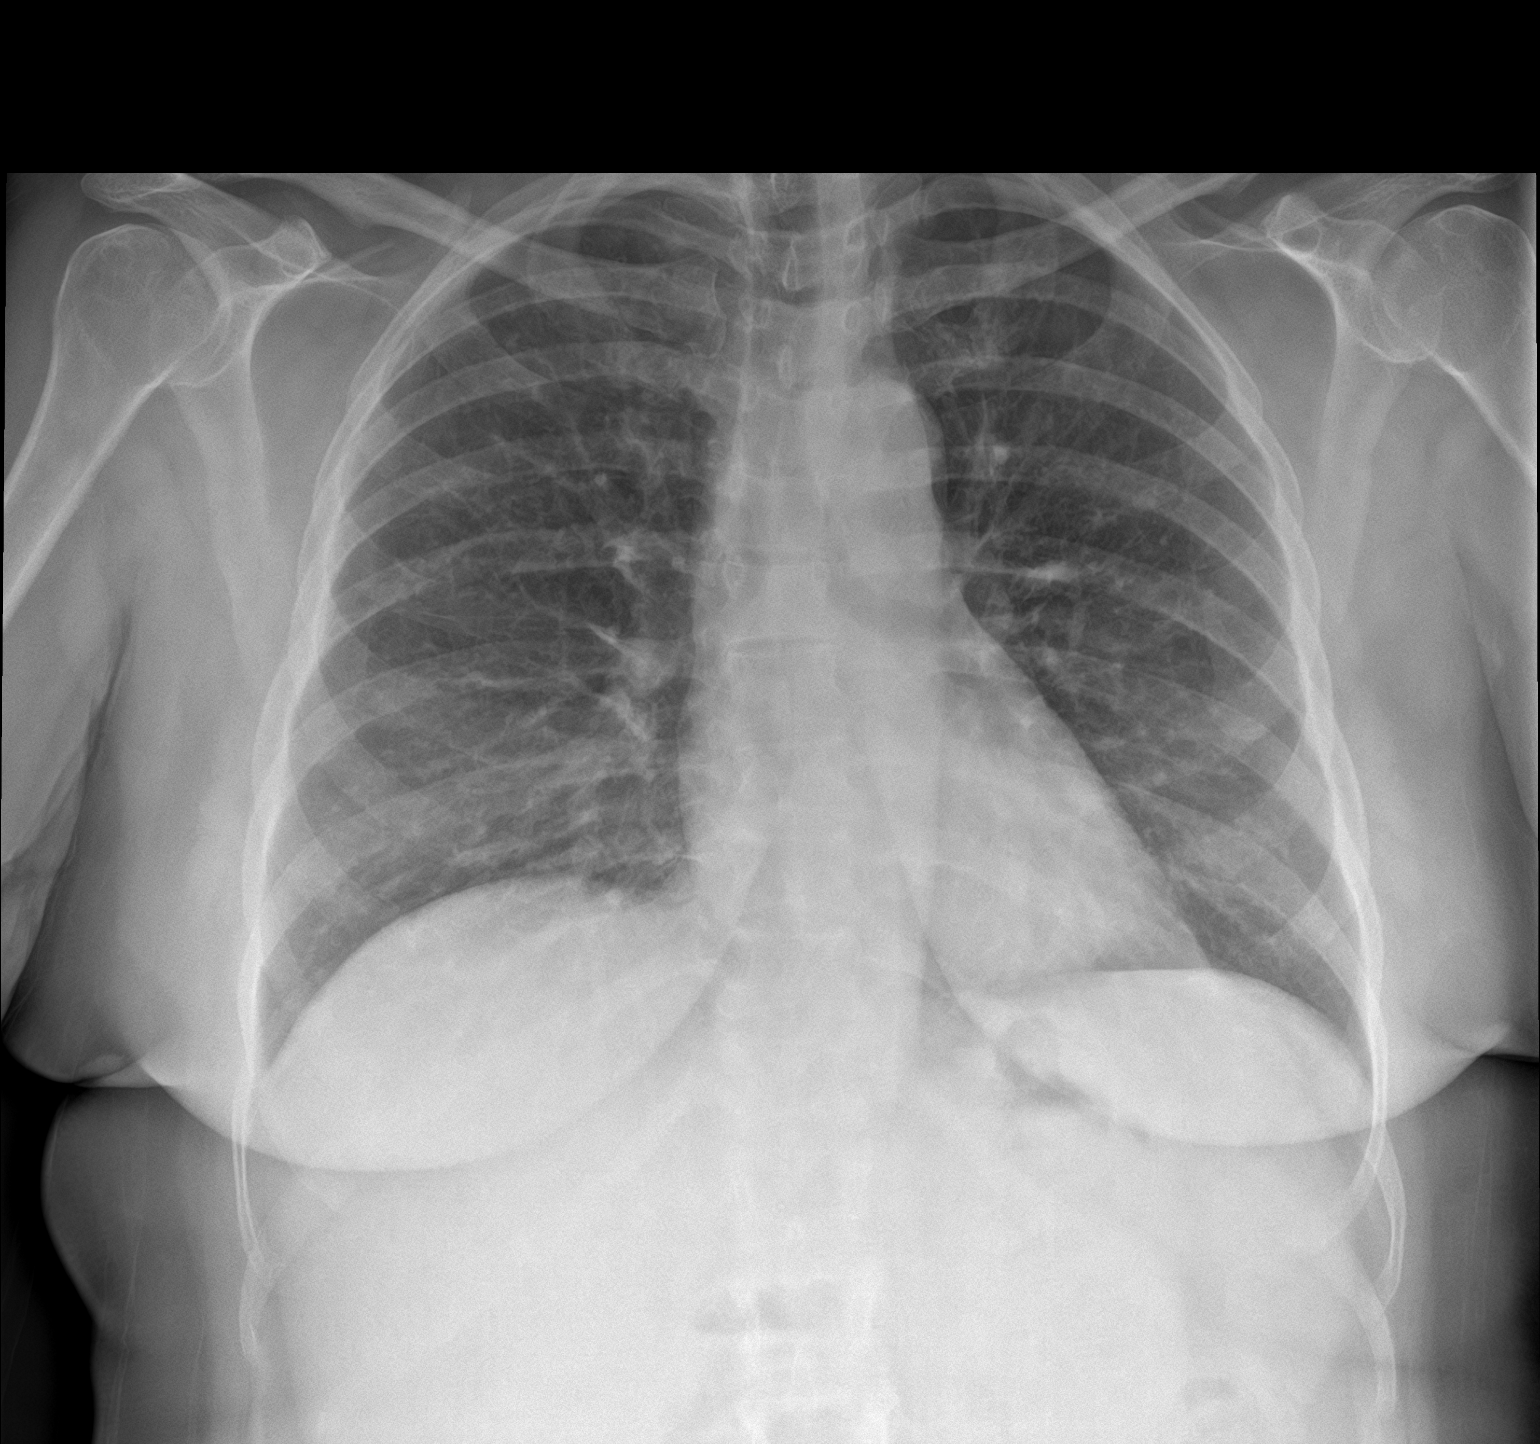

[chest lat]
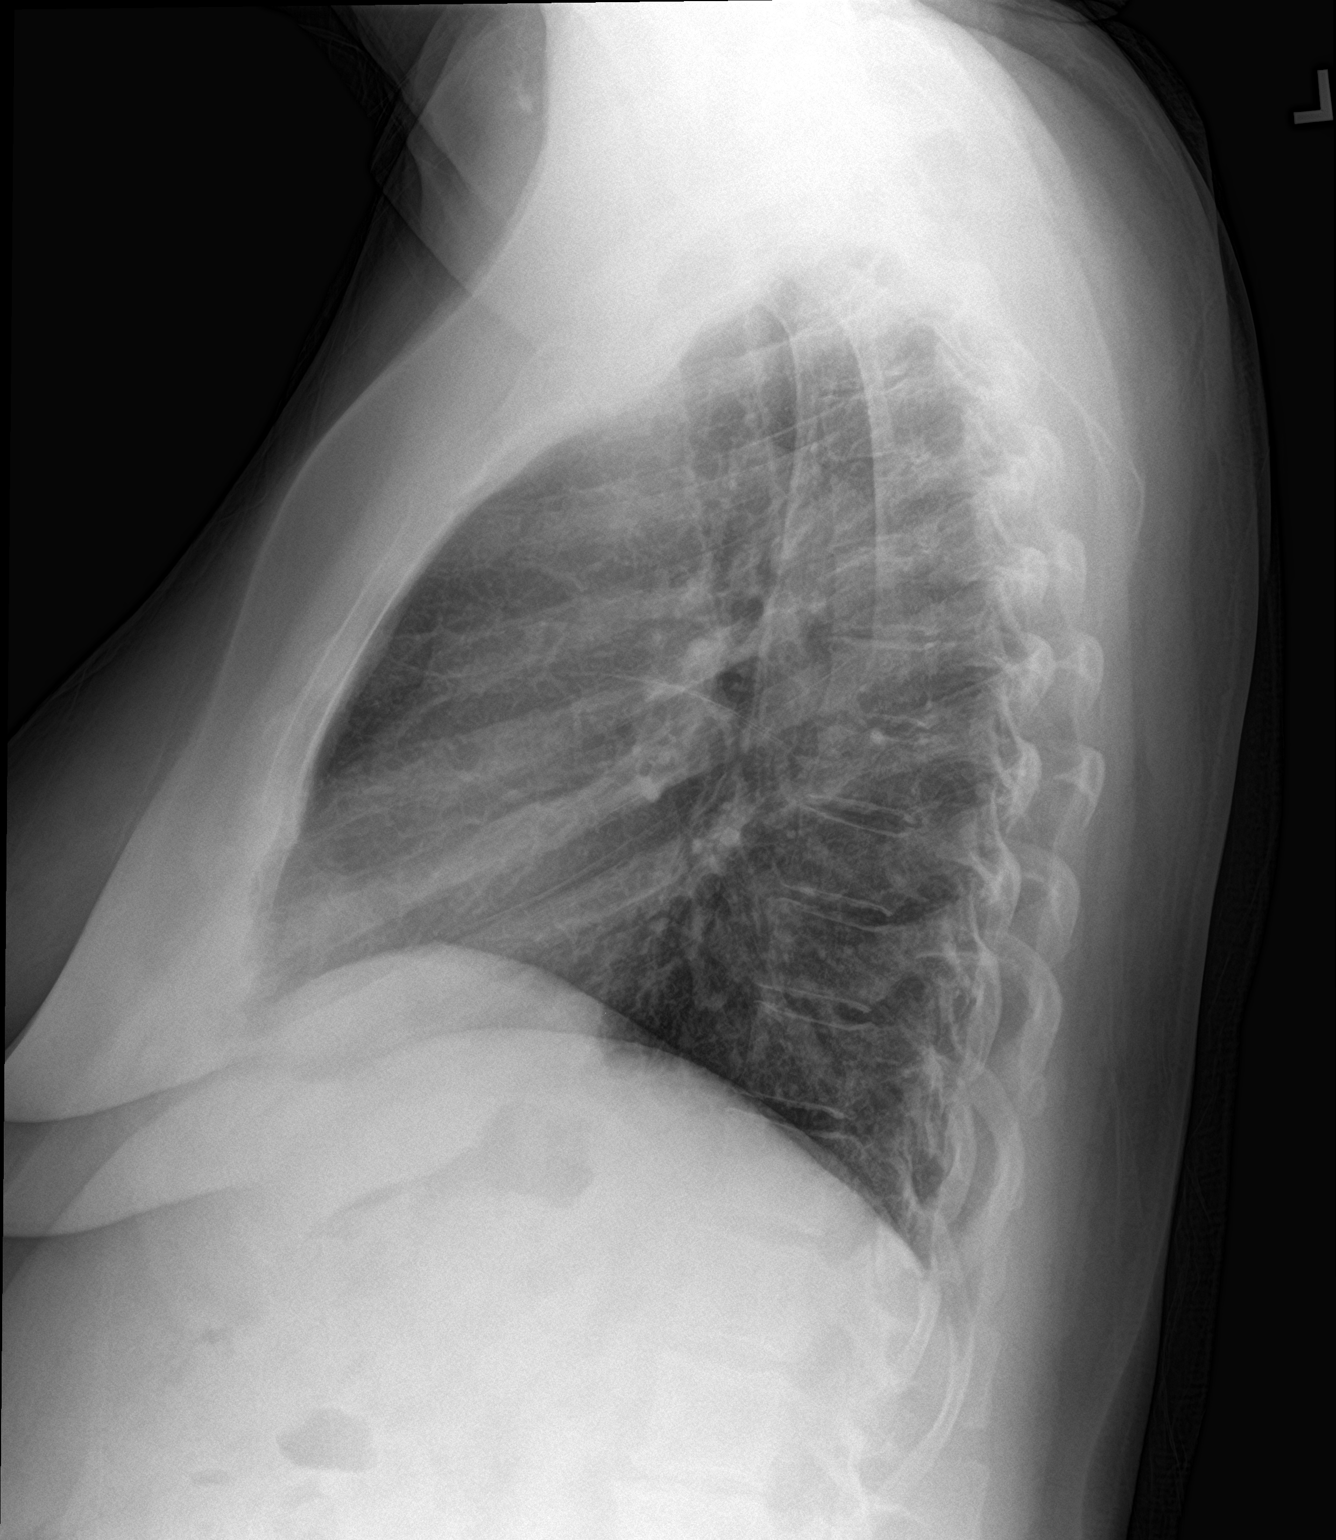

[2 of 2 positions shown; findings below may reference images not displayed]

FINDINGS: Lung volumes and mediastinal contours remain normal. Visualized
tracheal air column is within normal limits. No pneumothorax,
pulmonary edema, pleural effusion or consolidation. But increased
coarse bilateral interstitial opacity since 3423 radiographs,
although not significantly changed from 8383. No definite acute
pulmonary opacity. No acute osseous abnormality identified. Negative
visible bowel gas pattern.
IMPRESSION: Progressed pulmonary interstitial changes since 3423 but no acute
cardiopulmonary abnormality.

## 2021-03-27 ENCOUNTER — Other Ambulatory Visit: Payer: Self-pay

## 2021-03-27 MED ORDER — HYDROCHLOROTHIAZIDE 25 MG PO TABS: 25.0000 mg | ORAL_TABLET | Freq: Every day | ORAL | 0 refills | Status: DC

## 2021-03-27 NOTE — Telephone Encounter (Signed)
Patient over due for follow up appointment. Per Dr. Jacinto Reap, ok to give refill to get by until appointment scheduled for 05/26/2021.

## 2021-05-23 IMAGING — CR DG NECK SOFT TISSUE
2 series · 2 of 2 positions shown · non-contrast
Comparison: None.

CLINICAL DATA: Foreign body aspiration

EXAM:
NECK SOFT TISSUES - 1+ VIEW

[neck lat]
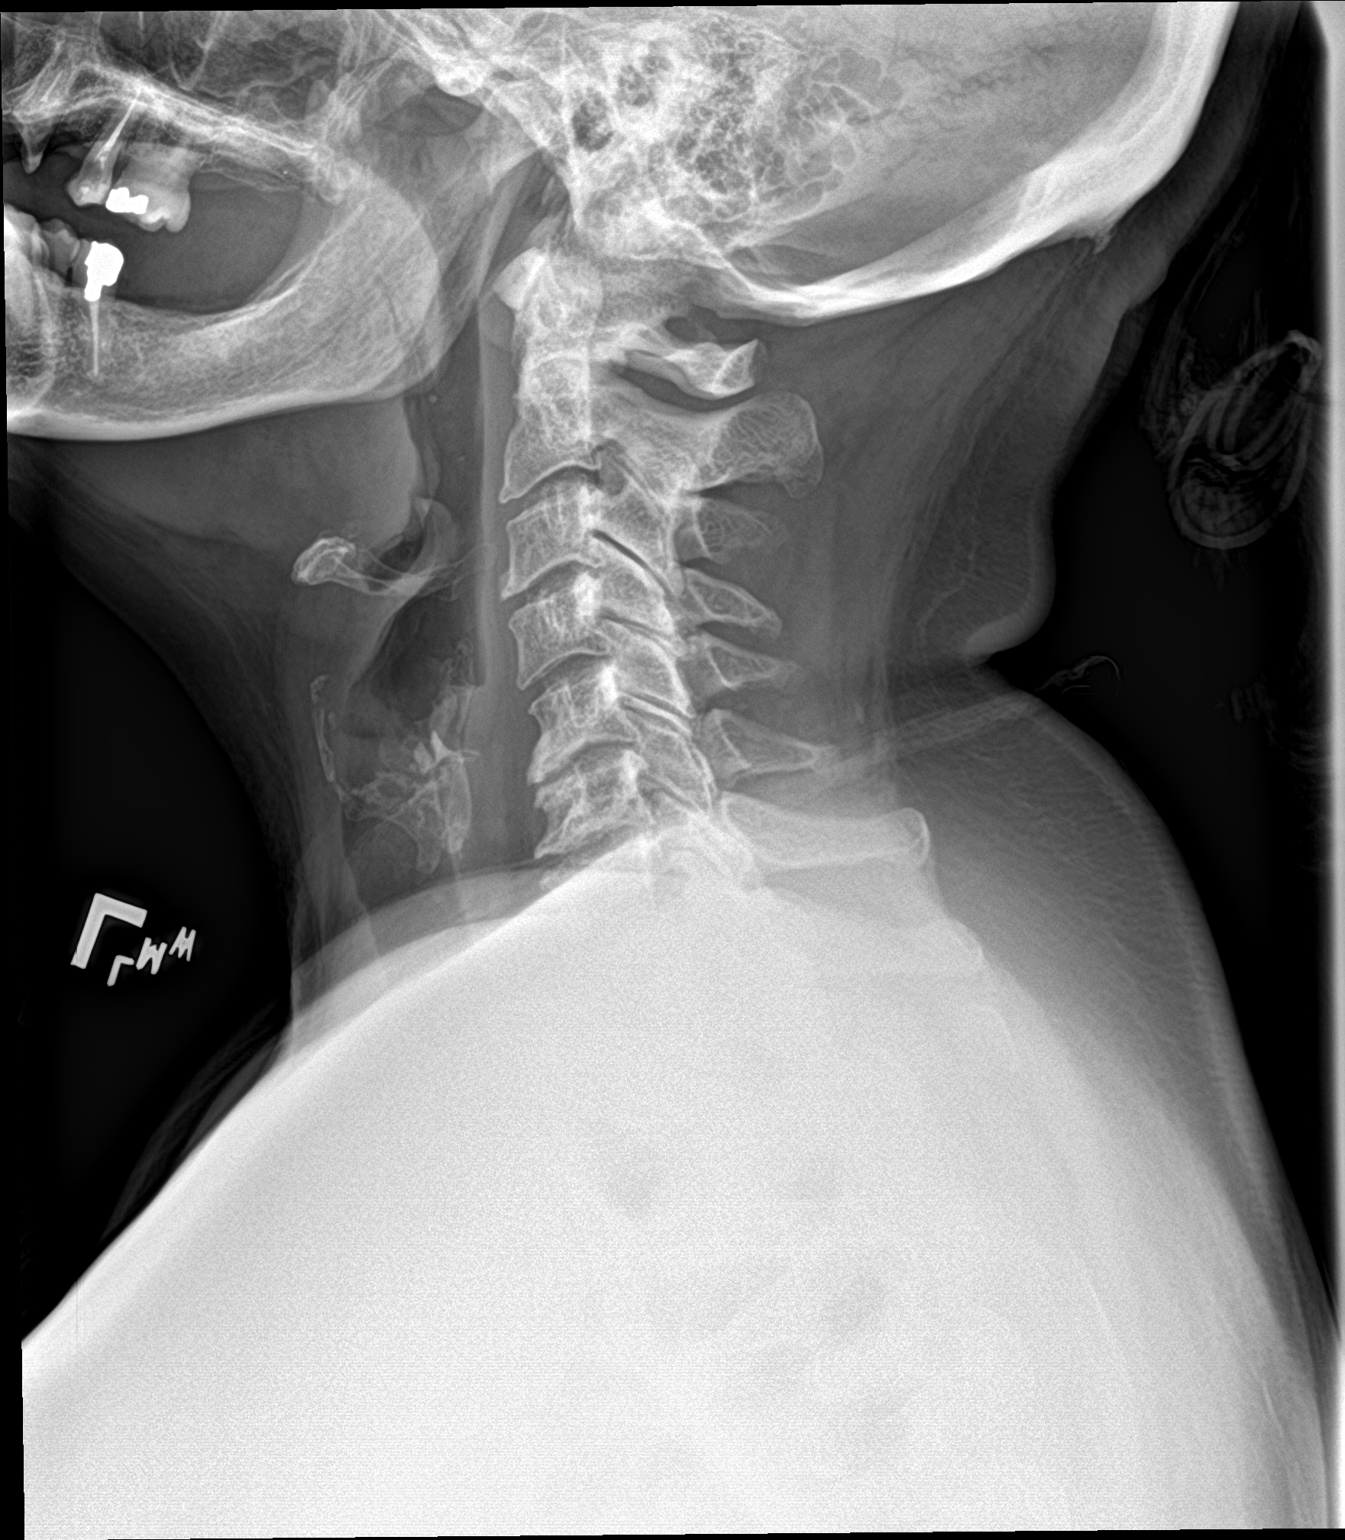

[neck ap]
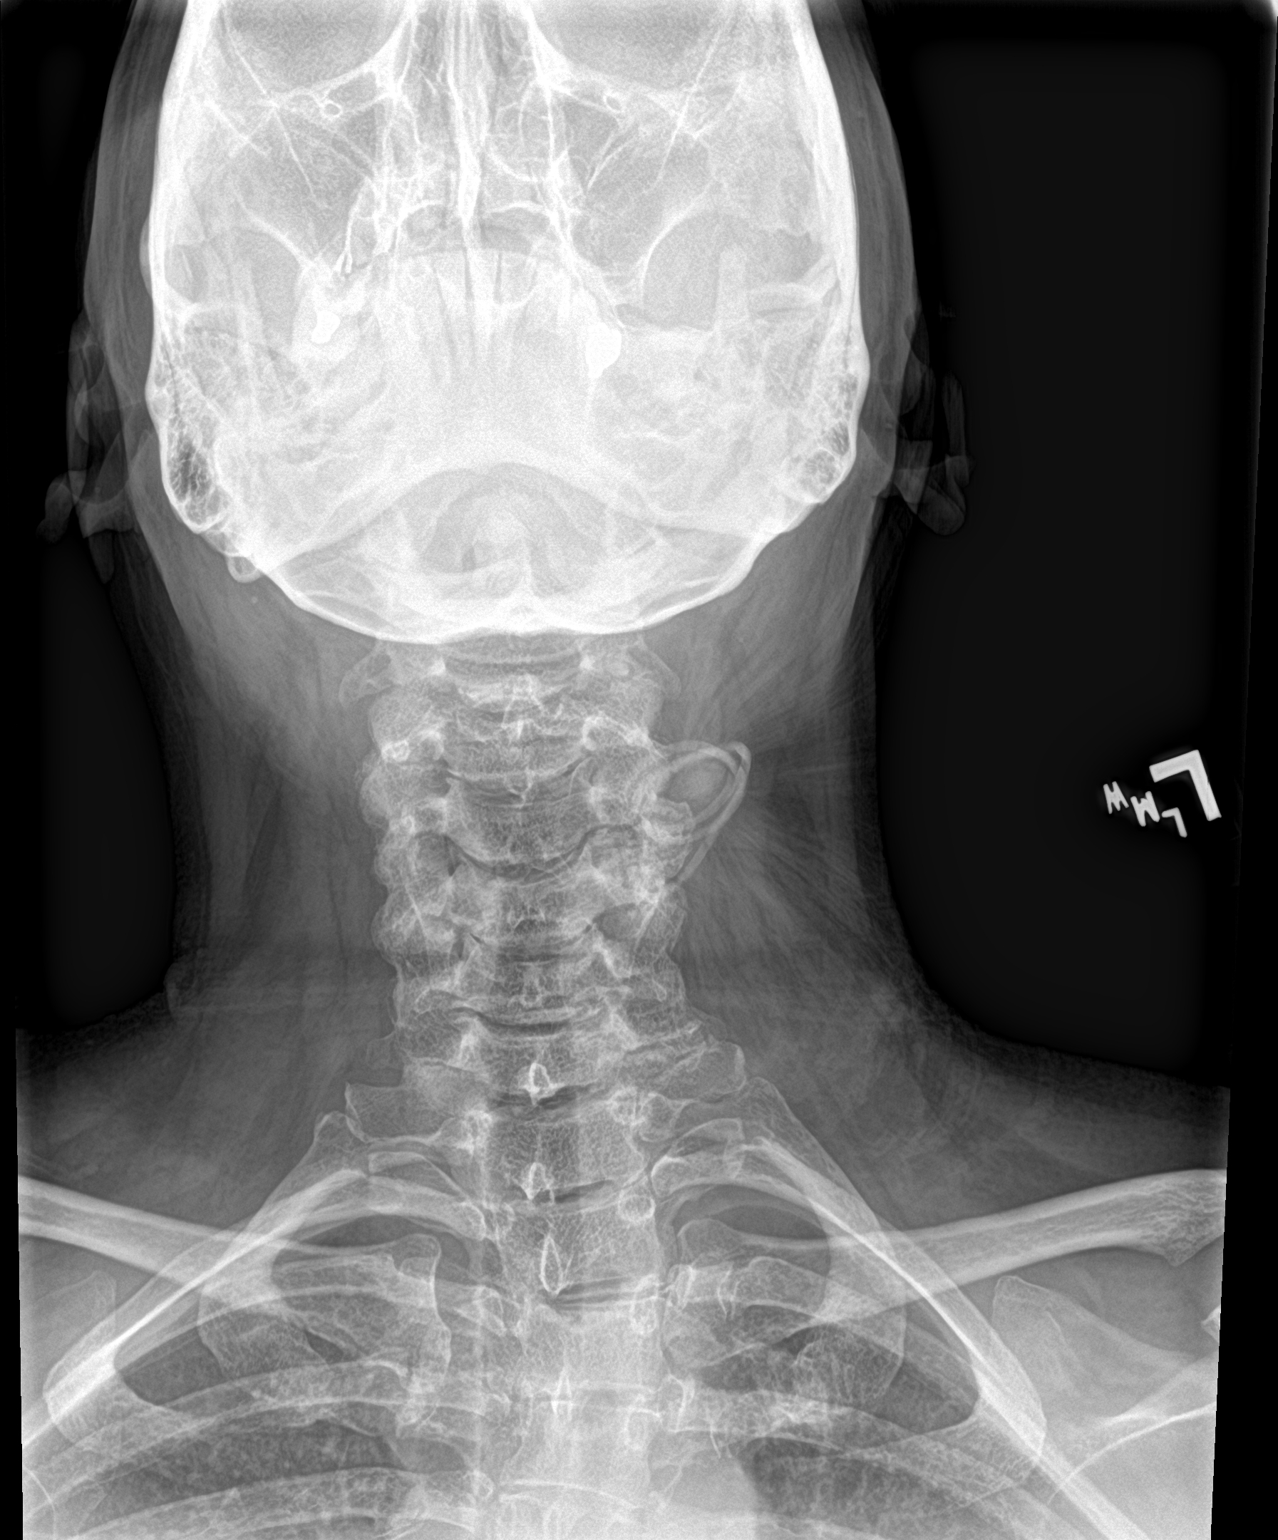

[2 of 2 positions shown; findings below may reference images not displayed]

FINDINGS: Two view radiograph of the neck soft tissues. The epiglottis and
aryepiglottic folds are normal. The prevertebral soft tissues are
not thickened. Physiologic calcification of the thyroid cartilage.
No retained radiopaque foreign body identified within the neck soft
tissues. Degenerative changes are seen within the cervical spine.
IMPRESSION: No retained radiopaque foreign body within the neck soft tissues.

## 2021-05-23 NOTE — Progress Notes (Signed)
? ? ? ?I,Crystal Nixon,acting as a scribe for Crystal Paganini, MD.,have documented all relevant documentation on the behalf of Crystal Paganini, MD,as directed by  Crystal Paganini, MD while in the presence of Crystal Paganini, MD. ?  ? ? ?Established patient visit ? ? ?Patient: Crystal Nixon   DOB: 07/07/1965   56 y.o. Female  MRN: [id number] ?Visit Date: 05/26/2021 ? ?Today's healthcare provider: [name], MD  ? ?Chief Complaint  ?Patient presents with  ? Hypertension  ? ?Subjective  ?  ?HPI  ?Hypertension, follow-up ? ?BP Readings from Last 3 Encounters:  ?05/26/21 124/86  ?05/11/20 (!) 148/89  ?02/20/20 124/84  ? Wt Readings from Last 3 Encounters:  ?05/26/21 161 lb 12.8 oz (73.4 kg)  ?02/20/20 168 lb (76.2 kg)  ?09/18/19 170 lb (77.1 kg)  ?  ? ?She was last seen for hypertension 21 months ago.  ?BP at that visit was 119/77. Management since that visit includes no changes. ? ?She reports excellent compliance with treatment. ?She is not having side effects.  ?She is following a Low Sodium diet. ?She is exercising. ?She does not smoke. ? ?Use of agents associated with hypertension: none.  ? ?Outside blood pressures are elevated. ?Symptoms: ?No chest pain No chest pressure  ?No palpitations No syncope  ?No dyspnea No orthopnea  ?No paroxysmal nocturnal dyspnea No lower extremity edema  ? ?Pertinent labs: ?Lab Results  ?Component Value Date  ? CHOL 198 03/14/2019  ? HDL 37 (L) 03/14/2019  ? [place] 79 03/14/2019  ? TRIG 512 (H) 03/14/2019  ? CHOLHDL 5.4 (H) 03/14/2019  ? Lab Results  ?Component Value Date  ? NA 137 09/18/2019  ? K 3.6 09/18/2019  ? CREATININE 0.65 09/18/2019  ? GFRNONAA >60 09/18/2019  ? GLUCOSE 117 (H) 09/18/2019  ? TSH 1.420 01/12/2019  ?  ? ?The 10-year ASCVD risk score (Arnett DK, et al., 2019) is: 3.6%  ? ?--------------------------------------------------------------------------------------------------- ?Anxiety, Follow-up ? ?She was last seen for anxiety 21 months  ago. ?Changes made at last visit include D/C duloxetine, did not tolerate medication. ? ?She feels her anxiety is severe and Unchanged since last visit. ? ?Symptoms: ?No chest pain Yes difficulty concentrating  ?No dizziness No fatigue  ?No feelings of losing control Yes insomnia  ?Yes irritable No palpitations  ?No panic attacks Yes racing thoughts  ?Yes shortness of breath Yes sweating  ?No tremors/shakes   ? ?GAD-7 Results ?GAD-7 Generalized Anxiety Disorder Screening Tool 06/02/2019  ?1. Feeling Nervous, Anxious, or on Edge 3  ?2. Not Being Able to Stop or Control Worrying 3  ?3. Worrying Too Much About Different Things 3  ?4. Trouble Relaxing 3  ?5. Being So Restless it's Hard To Sit Still 2  ?6. Becoming Easily Annoyed or Irritable 3  ?7. Feeling Afraid As If Something Awful Might Happen 2  ?Total GAD-7 Score 19  ?Difficulty At Work, Home, or Getting  Along With Others? Extremely difficult  ? ? ?PHQ-9 Scores ?PHQ9 SCORE ONLY 02/20/2020 08/17/2019 06/02/2019  ?PHQ-9 Total Score 0 4 17  ? ? ?--------------------------------------------------------------------------------------------------- ?Had teeth pulled 3 days ago - having R ear pain since that time.  Gabapentin and nortriptyline help her pain. Not sure she should reach out to the dentist. Took a pain med after teeth extraction and it took the pain away in her feet entirely.   ? ?L great toenail fungus. ? ?Blurry vision - worse in one eye than the other ? ?Stress urinary incontinence with laughing/coughing/etc.  Never done anything for it ? ?Medications: ?Outpatient Medications Prior to Visit  ?Medication Sig  ? gabapentin (NEURONTIN) 300 MG capsule TAKE 2 CAPSULES BY MOUTH THREE TIMES DAILY  ? nortriptyline (PAMELOR) 10 MG capsule START WITH 1 CAPSULE BY MOUTH NIGHTLY FOR ONE WEEK THEN INCREASE TO 2 CAPSULES NIGHTLY  ? [DISCONTINUED] hydrochlorothiazide (HYDRODIURIL) 25 MG tablet Take 1 tablet (25 mg total) by mouth daily.  ? [DISCONTINUED] albuterol (VENTOLIN  HFA) 108 (90 Base) MCG/ACT inhaler Inhale 2 puffs into the lungs every 6 (six) hours as needed for wheezing or shortness of breath. (Patient not taking: Reported on 02/20/2020)  ? ?No facility-administered medications prior to visit.  ? ? ?Review of Systems  ?Constitutional:  Positive for diaphoresis. Negative for appetite change.  ?HENT:  Positive for dental problem and ear pain.   ?Eyes:  Positive for visual disturbance.  ?Respiratory:  Positive for cough and shortness of breath.   ?Cardiovascular: Negative.   ?Musculoskeletal:  Positive for arthralgias and myalgias.  ?Psychiatric/Behavioral:  Positive for confusion and decreased concentration.   ? ?  ?  Objective  ?  ?BP 124/86 (BP Location: Left Arm, Patient Position: Sitting, Cuff Size: Large)   Pulse 71   Temp (!) 97 ?F (36.1 ?C) (Temporal)   Resp 16   Wt 161 lb 12.8 oz (73.4 kg)   BMI 29.59 kg/m?  ?BP Readings from Last 3 Encounters:  ?05/26/21 124/86  ?05/11/20 (!) 148/89  ?02/20/20 124/84  ? ?Wt Readings from Last 3 Encounters:  ?05/26/21 161 lb 12.8 oz (73.4 kg)  ?02/20/20 168 lb (76.2 kg)  ?09/18/19 170 lb (77.1 kg)  ? ?  ? ?Physical Exam ?Vitals reviewed.  ?Constitutional:   ?   General: She is not in acute distress. ?   Appearance: Normal appearance. She is well-developed. She is not diaphoretic.  ?HENT:  ?   Head: Normocephalic and atraumatic.  ?Eyes:  ?   General: No scleral icterus. ?   Conjunctiva/sclera: Conjunctivae normal.  ?Neck:  ?   Thyroid: No thyromegaly.  ?Cardiovascular:  ?   Rate and Rhythm: Normal rate and regular rhythm.  ?   Pulses: Normal pulses.  ?   Heart sounds: Normal heart sounds. No murmur heard. ?Pulmonary:  ?   Effort: Pulmonary effort is normal. No respiratory distress.  ?   Breath sounds: Normal breath sounds. No wheezing, rhonchi or rales.  ?Musculoskeletal:  ?   Cervical back: Neck supple.  ?   Right lower leg: No edema.  ?   Left lower leg: No edema.  ?Lymphadenopathy:  ?   Cervical: No cervical adenopathy.  ?Skin: ?    General: Skin is warm and dry.  ?   Findings: No rash.  ?Neurological:  ?   Mental Status: She is alert and oriented to person, place, and time. Mental status is at baseline.  ?Psychiatric:     ?   Mood and Affect: Mood normal.     ?   Behavior: Behavior normal.  ?  ?L great toenail thickened and yellow ? ?No results found for any visits on 05/26/21. ? Assessment & Plan  ?  ? ?Problem List Items Addressed This Visit   ? ?  ? Cardiovascular and Mediastinum  ? Essential hypertension - Primary  ?  Well controlled ?Continue current medications ?Recheck metabolic panel ?F/u in 6 months  ?  ?  ? Relevant Medications  ? hydrochlorothiazide (HYDRODIURIL) 25 MG tablet  ? Other Relevant Orders  ? Comprehensive metabolic  panel  ? Lipid panel  ?  ? Nervous and Auditory  ? Peripheral polyneuropathy  ?  F/b Neuro ?Continue gabapentin and Nortriptyline ?Upcoming f/u appt ?Continue compression stockings ?Advised that opioids are not a long term solution for her neuropathy ?  ?  ?  ? Musculoskeletal and Integument  ? Onychomycosis  ?  New problem ?L great toenail ?Will treat with terbinafine x12 weeks ?  ?  ? Relevant Medications  ? terbinafine (LAMISIL) 250 MG tablet  ?  ? Other  ? Adjustment disorder with mixed anxiety and depressed mood  ?  Reports that she is well controlled ?Refused to fill out screenings today ?States she does not need any meds ?  ?  ? Hyperglycemia  ?  Elevated on last labs ?Check A1c ?  ?  ? Relevant Orders  ? Hemoglobin A1c  ? Overweight  ?  Discussed importance of healthy weight management ?Discussed diet and exercise  ?  ?  ? Relevant Orders  ? Lipid panel  ? SUI (stress urinary incontinence, female)  ?  Discussed options of meds vs PT ?Patient opts for pelvic floor PT ?Referral placed today ?  ?  ? Relevant Orders  ? Ambulatory referral to Physical Therapy  ? ?Other Visit Diagnoses   ? ? Colon cancer screening      ? Relevant Orders  ? Cologuard  ? Encounter for screening mammogram for malignant  neoplasm of breast      ? Relevant Orders  ? MM 3D SCREEN BREAST BILATERAL  ? Blurred vision      ? Relevant Orders  ? Ambulatory referral to Ophthalmology  ? ?  ?  ? ?Return in about 6 months (around 11/26/2021)

## 2021-05-26 ENCOUNTER — Other Ambulatory Visit: Payer: Self-pay

## 2021-05-26 ENCOUNTER — Encounter: Payer: Self-pay | Admitting: Family Medicine

## 2021-05-26 ENCOUNTER — Ambulatory Visit (INDEPENDENT_AMBULATORY_CARE_PROVIDER_SITE_OTHER): Payer: Self-pay | Admitting: Family Medicine

## 2021-05-26 VITALS — BP 124/86 | HR 71 | Temp 97.0°F | Resp 16 | Wt 161.8 lb

## 2021-05-26 DIAGNOSIS — Z1231 Encounter for screening mammogram for malignant neoplasm of breast: Secondary | ICD-10-CM

## 2021-05-26 DIAGNOSIS — H538 Other visual disturbances: Secondary | ICD-10-CM

## 2021-05-26 DIAGNOSIS — E663 Overweight: Secondary | ICD-10-CM

## 2021-05-26 DIAGNOSIS — F4323 Adjustment disorder with mixed anxiety and depressed mood: Secondary | ICD-10-CM

## 2021-05-26 DIAGNOSIS — Z1211 Encounter for screening for malignant neoplasm of colon: Secondary | ICD-10-CM

## 2021-05-26 DIAGNOSIS — G629 Polyneuropathy, unspecified: Secondary | ICD-10-CM

## 2021-05-26 DIAGNOSIS — R739 Hyperglycemia, unspecified: Secondary | ICD-10-CM | POA: Insufficient documentation

## 2021-05-26 DIAGNOSIS — B351 Tinea unguium: Secondary | ICD-10-CM

## 2021-05-26 DIAGNOSIS — I10 Essential (primary) hypertension: Secondary | ICD-10-CM

## 2021-05-26 DIAGNOSIS — N393 Stress incontinence (female) (male): Secondary | ICD-10-CM

## 2021-05-26 MED ORDER — HYDROCHLOROTHIAZIDE 25 MG PO TABS: 25.0000 mg | ORAL_TABLET | Freq: Every day | ORAL | 1 refills | Status: DC

## 2021-05-26 MED ORDER — TERBINAFINE HCL 250 MG PO TABS: 250.0000 mg | ORAL_TABLET | Freq: Every day | ORAL | 0 refills | Status: AC

## 2021-05-26 NOTE — Assessment & Plan Note (Signed)
Discussed importance of healthy weight management Discussed diet and exercise  

## 2021-05-26 NOTE — Assessment & Plan Note (Signed)
Discussed options of meds vs PT ?Patient opts for pelvic floor PT ?Referral placed today ?

## 2021-05-26 NOTE — Assessment & Plan Note (Signed)
Elevated on last labs ?Check A1c ?

## 2021-05-26 NOTE — Assessment & Plan Note (Signed)
Well controlled Continue current medications Recheck metabolic panel F/u in 6 months  

## 2021-05-26 NOTE — Assessment & Plan Note (Signed)
F/b Neuro ?Continue gabapentin and Nortriptyline ?Upcoming f/u appt ?Continue compression stockings ?Advised that opioids are not a long term solution for her neuropathy ?

## 2021-05-26 NOTE — Assessment & Plan Note (Signed)
New problem ?L great toenail ?Will treat with terbinafine x12 weeks ?

## 2021-05-26 NOTE — Assessment & Plan Note (Signed)
Reports that she is well controlled ?Refused to fill out screenings today ?States she does not need any meds ?

## 2021-06-02 ENCOUNTER — Other Ambulatory Visit: Payer: Self-pay | Admitting: Neurology

## 2021-06-02 DIAGNOSIS — R200 Anesthesia of skin: Secondary | ICD-10-CM

## 2021-06-02 DIAGNOSIS — M5416 Radiculopathy, lumbar region: Secondary | ICD-10-CM

## 2021-06-02 DIAGNOSIS — R262 Difficulty in walking, not elsewhere classified: Secondary | ICD-10-CM

## 2021-06-25 LAB — COLOGUARD: COLOGUARD: NEGATIVE

## 2021-09-15 ENCOUNTER — Ambulatory Visit (INDEPENDENT_AMBULATORY_CARE_PROVIDER_SITE_OTHER): Payer: Self-pay | Admitting: Family Medicine

## 2021-09-15 VITALS — BP 144/86 | HR 85 | Temp 99.5°F | Wt 154.0 lb

## 2021-09-15 DIAGNOSIS — G629 Polyneuropathy, unspecified: Secondary | ICD-10-CM

## 2021-09-15 DIAGNOSIS — R051 Acute cough: Secondary | ICD-10-CM

## 2021-09-15 DIAGNOSIS — H6593 Unspecified nonsuppurative otitis media, bilateral: Secondary | ICD-10-CM

## 2021-09-15 DIAGNOSIS — F4323 Adjustment disorder with mixed anxiety and depressed mood: Secondary | ICD-10-CM

## 2021-09-15 DIAGNOSIS — J069 Acute upper respiratory infection, unspecified: Secondary | ICD-10-CM

## 2021-09-15 MED ORDER — DOXYCYCLINE HYCLATE 100 MG PO TABS: 100.0000 mg | ORAL_TABLET | Freq: Two times a day (BID) | ORAL | 1 refills | Status: DC

## 2021-09-15 NOTE — Progress Notes (Signed)
OM     Established patient visit   Patient: Crystal Nixon   DOB: 06-13-1965   56 y.o. Female  MRN: [id number] Visit Date: 09/15/2021  Today's healthcare provider: Megan Mans, MD   No chief complaint on file.  Subjective    HPI  Patient is a 56 year old female who presents with complaint of bilateral ear pain.  Patient was recently at a funeral and states that "everyone was sick".  She has since then had head and chest congestion.   Her main complaint is bilateral ear discomfort and \\she  feels as though she is talking in a drum. Patient has not been taking her medication except for her HCTZ because of using Tylenol Sinus and some cough syrup.  Medications: Outpatient Medications Prior to Visit  Medication Sig   gabapentin (NEURONTIN) 300 MG capsule TAKE 2 CAPSULES BY MOUTH THREE TIMES DAILY   hydrochlorothiazide (HYDRODIURIL) 25 MG tablet Take 1 tablet (25 mg total) by mouth daily.   nortriptyline (PAMELOR) 10 MG capsule START WITH 1 CAPSULE BY MOUTH NIGHTLY FOR ONE WEEK THEN INCREASE TO 2 CAPSULES NIGHTLY   No facility-administered medications prior to visit.    Review of Systems  Constitutional:  Positive for chills and diaphoresis. Negative for fatigue and fever.  HENT:  Positive for congestion, ear discharge, ear pain, hearing loss, nosebleeds, postnasal drip, sinus pressure, sinus pain, sneezing, sore throat, tinnitus, trouble swallowing and voice change. Negative for dental problem, mouth sores and rhinorrhea.   Respiratory:  Positive for cough, shortness of breath and wheezing. Negative for stridor.   Cardiovascular:  Positive for chest pain. Negative for palpitations and leg swelling.  Gastrointestinal:  Negative for abdominal pain, blood in stool, constipation, diarrhea, nausea and vomiting.  Musculoskeletal:  Positive for arthralgias.  Neurological:  Positive for dizziness, light-headedness and headaches.       Objective    BP (!) 144/86 (BP  Location: Right Arm, Patient Position: Sitting, Cuff Size: Normal)   Pulse 85   Temp 99.5 F (37.5 C) (Oral)   Wt 154 lb (69.9 kg)   SpO2 94%   BMI 28.17 kg/m  Vitals:   09/15/21 1426 09/15/21 1428  BP: (!) 129/92 (!) 144/86  Pulse: 85   Temp: 99.5 F (37.5 C)   TempSrc: Oral   SpO2: 94%   Weight: 154 lb (69.9 kg)      Physical Exam Vitals reviewed.  Constitutional:      General: She is not in acute distress.    Appearance: She is well-developed.  HENT:     Head: Normocephalic and atraumatic.     Right Ear: Hearing and external ear normal.     Left Ear: Hearing and external ear normal.     Ears:     Comments: Right TM is full and erythematous on the upper portion, left TM is full and erythematous throughout.  TM is bulging some    Nose: Nose normal.  Eyes:     General: Lids are normal. No scleral icterus.       Right eye: No discharge.        Left eye: No discharge.     Conjunctiva/sclera: Conjunctivae normal.  Cardiovascular:     Rate and Rhythm: Normal rate and regular rhythm.     Heart sounds: Normal heart sounds.  Pulmonary:     Effort: Pulmonary effort is normal. No respiratory distress.  Skin:    Findings: No lesion or rash.  Neurological:  General: No focal deficit present.     Mental Status: She is alert and oriented to person, place, and time.  Psychiatric:        Mood and Affect: Mood normal.        Speech: Speech normal.        Behavior: Behavior normal.        Thought Content: Thought content normal.        Judgment: Judgment normal.       No results found for any visits on 09/15/21.  Assessment & Plan     1. OME (otitis media with effusion), bilateral Doxycycline for 5 days.  2. Viral upper respiratory tract infection Push fluids and use Robitussin-DM for symptom control  3. Acute cough No reason for chest x-ray at this time.  4. Peripheral polyneuropathy Followed by neurology.  5. Adjustment disorder with mixed anxiety and  depressed mood Patient also has an issue with noncompliance that she did not get her lab work ordered in the spring has not had labs done in 2 years   No follow-ups on file.      I, Megan Mans, MD, have reviewed all documentation for this visit. The documentation on 09/15/21 for the exam, diagnosis, procedures, and orders are all accurate and complete.    Evyn Putzier Wendelyn Breslow, MD  [facility] [phone] (phone) [phone] (fax)  [facility]

## 2021-09-19 ENCOUNTER — Encounter: Payer: Self-pay | Admitting: Physician Assistant

## 2021-09-19 ENCOUNTER — Ambulatory Visit: Payer: Self-pay | Admitting: *Deleted

## 2021-09-19 ENCOUNTER — Ambulatory Visit (INDEPENDENT_AMBULATORY_CARE_PROVIDER_SITE_OTHER): Payer: Self-pay | Admitting: Physician Assistant

## 2021-09-19 ENCOUNTER — Telehealth: Payer: Self-pay

## 2021-09-19 VITALS — BP 138/82 | HR 90 | Temp 98.5°F | Resp 16 | Wt 160.5 lb

## 2021-09-19 DIAGNOSIS — H9203 Otalgia, bilateral: Secondary | ICD-10-CM

## 2021-09-19 MED ORDER — PREDNISONE 20 MG PO TABS: 20.0000 mg | ORAL_TABLET | Freq: Every day | ORAL | 0 refills | Status: DC

## 2021-09-19 NOTE — Telephone Encounter (Signed)
Received call from Albertina Parr regarding this pt.  Ms Su Hilt works for [employer] and has questions regarding pt's current status. Ms. Su Hilt states that her company attempted contact with pt recently and was told by party answering phone that pt was in bed and not well. Please return call to Ms. Su Hilt. [phone]

## 2021-09-19 NOTE — Telephone Encounter (Signed)
Please advise 

## 2021-09-19 NOTE — Telephone Encounter (Signed)
Reason for Disposition  Earache  (Exceptions: brief ear pain of < 60 minutes duration, earache occurring during air travel  Answer Assessment - Initial Assessment Questions 1. LOCATION: "Which ear is involved?"     Ear pain but I feel like there is a fish bowel in my head.   I'm hearing things on the right but they are really on the left.   I have an infection in my ear and it's gotten worse.   My throat is better but my ears are not getting better.     Dr. Sullivan Lone saw me. 2. ONSET: "When did the ear start hurting"      Both ears 3. SEVERITY: "How bad is the pain?"  (Scale 1-10; mild, moderate or severe)   - MILD (1-3): doesn't interfere with normal activities    - MODERATE (4-7): interferes with normal activities or awakens from sleep    - SEVERE (8-10): excruciating pain, unable to do any normal activities      Moderate 4. URI SYMPTOMS: "Do you have a runny nose or cough?"     I'm much better now but my ears are not better. 5. FEVER: "Do you have a fever?" If Yes, ask: "What is your temperature, how was it measured, and when did it start?"     Not asked 6. CAUSE: "Have you been swimming recently?", "How often do you use Q-TIPS?", "Have you had any recent air travel or scuba diving?"     N/A 7. OTHER SYMPTOMS: "Do you have any other symptoms?" (e.g., headache, stiff neck, dizziness, vomiting, runny nose, decreased hearing)     Feel swollen inside and pressure. 8. PREGNANCY: "Is there any chance you are pregnant?" "When was your last menstrual period?"     N/A  Protocols used: Earache-A-AH  Chief Complaint: Bilateral ear pain and pressure not better after taking antibiotics Symptoms: A lot of pressure in both ears.  Her other symptoms have gotten better on the antibiotics. "Feels like I'm in a fish bowel" Frequency: For at least the last week.   Pertinent Negatives: Patient denies fever. Disposition: [] ED /[] Urgent Care (no appt availability in office) / [x] Appointment(In  office/virtual)/ []  [place] [facility]/ [] Home Care/ [] Refused Recommended Disposition /[] [place]/ []  Follow-up with PCP Additional Notes: Appt. Made for today with , PA-C for 3:00 today.

## 2021-09-19 NOTE — Progress Notes (Unsigned)
      I,Rogue Rafalski Robinson,acting as a Neurosurgeon for [employer], PA-C.,have documented all relevant documentation on the behalf of Debera Lat, PA-C,as directed by  [employer], PA-C while in the presence of [employer], PA-C.  Established patient visit   Patient: Crystal Nixon   DOB: 1966/01/31   56 y.o. Female  MRN: [id number] Visit Date: 09/19/2021  Today's healthcare provider: Debera Lat, PA-C   Chief Complaint  Patient presents with   Ear Pain  Ear pressure  Subjective    Patient presents for continued bilateral ear pressure and pain.  States it feels like head is underwater. She was seen on 09-15-21 with Dr. Sullivan Lone and given Doxycycline for 5 days and took last dose this morning. Taking Tylenol or Ibuprofen every 3 hours. Other symptoms from that visit have resolved.    Medications: Outpatient Medications Prior to Visit  Medication Sig   doxycycline (VIBRA-TABS) 100 MG tablet Take 1 tablet (100 mg total) by mouth 2 (two) times daily.   gabapentin (NEURONTIN) 300 MG capsule TAKE 2 CAPSULES BY MOUTH THREE TIMES DAILY   hydrochlorothiazide (HYDRODIURIL) 25 MG tablet Take 1 tablet (25 mg total) by mouth daily.   nortriptyline (PAMELOR) 10 MG capsule START WITH 1 CAPSULE BY MOUTH NIGHTLY FOR ONE WEEK THEN INCREASE TO 2 CAPSULES NIGHTLY (Patient not taking: Reported on 09/19/2021)   No facility-administered medications prior to visit.    Review of Systems  {Labs  Heme  Chem  Endocrine  Serology  Results Review (optional):23779}   Objective    BP 138/82 (BP Location: Left Arm, Patient Position: Sitting, Cuff Size: Normal)   Pulse 90   Temp 98.5 F (36.9 C) (Oral)   Resp 16   Wt 160 lb 8 oz (72.8 kg)   SpO2 96%   BMI 29.36 kg/m  {Show previous vital signs (optional):23777}  Physical Exam  ***  No results found for any visits on 09/19/21.  Assessment & Plan     1. Ear pain, bilateral Completed a course of doxycycline 5 day course. - predniSONE  (DELTASONE) 20 MG tablet; Take 1 tablet (20 mg total) by mouth daily with breakfast.  Dispense: 5 tablet; Refill: 0 Might add an another abx  Will reassess next week if symptoms worsen  Fu in 1 week    .The patient was advised to call back or seek an in-person evaluation if the symptoms worsen or if the condition fails to improve as anticipated.  I discussed the assessment and treatment plan with the patient. The patient was provided an opportunity to ask questions and all were answered. The patient agreed with the plan and demonstrated an understanding of the instructions.  The entirety of the information documented in the History of Present Illness, Review of Systems and Physical Exam were personally obtained by me. Portions of this information were initially documented by the CMA and reviewed by me for thoroughness and accuracy.  Portions of this note were created using dictation software and may contain typographical errors.     Debera Lat, PA-C  [facility] [phone] (phone) [phone] (fax)  [facility]

## 2021-09-24 ENCOUNTER — Other Ambulatory Visit: Payer: Self-pay

## 2021-09-24 ENCOUNTER — Emergency Department
Admission: EM | Admit: 2021-09-24 | Discharge: 2021-09-24 | Discharge status: Against Medical Advice | Payer: Self-pay | Attending: Emergency Medicine | Admitting: Emergency Medicine

## 2021-09-24 ENCOUNTER — Emergency Department: Payer: Self-pay

## 2021-09-24 DIAGNOSIS — W108XXA Fall (on) (from) other stairs and steps, initial encounter: Secondary | ICD-10-CM | POA: Insufficient documentation

## 2021-09-24 DIAGNOSIS — R55 Syncope and collapse: Secondary | ICD-10-CM | POA: Insufficient documentation

## 2021-09-24 DIAGNOSIS — Y9301 Activity, walking, marching and hiking: Secondary | ICD-10-CM | POA: Insufficient documentation

## 2021-09-24 DIAGNOSIS — Z5321 Procedure and treatment not carried out due to patient leaving prior to being seen by health care provider: Secondary | ICD-10-CM | POA: Insufficient documentation

## 2021-09-24 NOTE — ED Triage Notes (Signed)
Pt presents to ER from home c/o fall that happened around 1hr ago.  Pt states she was walking with her cane, and states she has had recent ear infection which has made her dizzy.  Pt states she bent down to pick up something and fell down some steps, hitting top of her head on part of the concrete steps.  Pt states she hit her tailbone as well in the fall.  Pt endorses LOC.  No bleeding noted at this time.  Pt endorses severe HA and is seeing spots.

## 2021-09-24 NOTE — Progress Notes (Deleted)
      Established patient visit   Patient: Crystal Nixon   DOB: 05/31/1965   56 y.o. Female  MRN: [id number] Visit Date: 09/25/2021  Today's healthcare provider: Debera Lat, PA-C   No chief complaint on file.  Subjective    HPI  Follow up for ear pain  The patient was last seen for this 1 weeks ago. Changes made at last visit include Prednisone.  She reports {excellent/good/fair/poor:19665} compliance with treatment. She feels that condition is {improved/worse/unchanged:3041574}. She {is/is not:21021397} having side effects. ***  -----------------------------------------------------------------------------------------   Medications: Outpatient Medications Prior to Visit  Medication Sig   doxycycline (VIBRA-TABS) 100 MG tablet Take 1 tablet (100 mg total) by mouth 2 (two) times daily.   gabapentin (NEURONTIN) 300 MG capsule TAKE 2 CAPSULES BY MOUTH THREE TIMES DAILY   hydrochlorothiazide (HYDRODIURIL) 25 MG tablet Take 1 tablet (25 mg total) by mouth daily.   nortriptyline (PAMELOR) 10 MG capsule START WITH 1 CAPSULE BY MOUTH NIGHTLY FOR ONE WEEK THEN INCREASE TO 2 CAPSULES NIGHTLY (Patient not taking: Reported on 09/19/2021)   predniSONE (DELTASONE) 20 MG tablet Take 1 tablet (20 mg total) by mouth daily with breakfast.   No facility-administered medications prior to visit.    Review of Systems  {Labs  Heme  Chem  Endocrine  Serology  Results Review (optional):23779}   Objective    There were no vitals taken for this visit. {Show previous vital signs (optional):23777}  Physical Exam  ***  No results found for any visits on 09/25/21.  Assessment & Plan     ***  No follow-ups on file.      {provider attestation***:1}   Debera Lat, Cordelia Poche  [facility] [phone] (phone) [phone] (fax)  [facility]

## 2021-09-25 ENCOUNTER — Ambulatory Visit: Payer: Self-pay | Admitting: Physician Assistant

## 2021-10-25 ENCOUNTER — Encounter: Payer: Self-pay | Admitting: Emergency Medicine

## 2021-10-25 ENCOUNTER — Emergency Department
Admission: EM | Admit: 2021-10-25 | Discharge: 2021-10-25 | Disposition: A | Discharge status: Home / Self Care | Payer: Self-pay | Attending: Emergency Medicine | Admitting: Emergency Medicine

## 2021-10-25 ENCOUNTER — Other Ambulatory Visit: Payer: Self-pay

## 2021-10-25 ENCOUNTER — Emergency Department: Payer: Self-pay

## 2021-10-25 DIAGNOSIS — R0989 Other specified symptoms and signs involving the circulatory and respiratory systems: Secondary | ICD-10-CM | POA: Insufficient documentation

## 2021-10-25 DIAGNOSIS — R1310 Dysphagia, unspecified: Secondary | ICD-10-CM | POA: Insufficient documentation

## 2021-10-25 DIAGNOSIS — I10 Essential (primary) hypertension: Secondary | ICD-10-CM | POA: Insufficient documentation

## 2021-10-25 MED ORDER — LIDOCAINE VISCOUS HCL 2 % MT SOLN
15.0000 mL | Freq: Once | OROMUCOSAL | Status: AC
  Administered 2021-10-25: 15 mL via ORAL
  Filled 2021-10-25: qty 15

## 2021-10-25 MED ORDER — ALUM & MAG HYDROXIDE-SIMETH 200-200-20 MG/5ML PO SUSP
30.0000 mL | Freq: Once | ORAL | Status: AC
  Administered 2021-10-25: 30 mL via ORAL
  Filled 2021-10-25: qty 30

## 2021-10-25 MED ORDER — OMEPRAZOLE MAGNESIUM 20 MG PO TBEC: 20.0000 mg | DELAYED_RELEASE_TABLET | Freq: Every day | ORAL | 1 refills | Status: AC

## 2021-10-25 NOTE — ED Provider Notes (Signed)
[facility] Provider Note    Event Date/Time   First MD Initiated Contact with Patient 10/25/21 [phone]     (approximate)   History   Chief Complaint Dysphagia   HPI  Crystal Nixon is a 56 y.o. female with past medical history of hypertension and neuropathy who presents to the ED complaining of dysphagia.  Patient reports that for the past couple of weeks she has been dealing with worsening pain in her throat whenever she goes to swallow.  She states that it is especially painful when she goes to swallow solids, points to the lower part of her neck as the area of pain.  She deals with similar but mild pain when swallowing liquids, states the solids sometimes feel like they get stuck but the liquids passed with minimal difficulty.  She has not had any nausea or vomiting, has been tolerating her oral secretions without difficulty.  She denies any fevers, cough, chest pain, or shortness of breath.  She denies significant NSAID or steroid use, does not drink alcohol regularly.  She denies significant history of GERD, has never had an endoscopy.     Physical Exam   Triage Vital Signs: ED Triage Vitals  Enc Vitals Group     BP 10/25/21 0816 (!) 160/93     Pulse Rate 10/25/21 0816 96     Resp 10/25/21 0816 17     Temp 10/25/21 0816 98.3 F (36.8 C)     Temp Source 10/25/21 0816 Oral     SpO2 10/25/21 0816 96 %     Weight 10/25/21 0815 149 lb 14.6 oz (68 kg)     Height 10/25/21 0815 5\' 2"  (1.575 m)     Head Circumference --      Peak Flow --      Pain Score 10/25/21 0814 6     Pain Loc --      Pain Edu? --      Excl. in GC? --     Most recent vital signs: Vitals:   10/25/21 0816  BP: (!) 160/93  Pulse: 96  Resp: 17  Temp: 98.3 F (36.8 C)  SpO2: 96%    Constitutional: Alert and oriented. Eyes: Conjunctivae are normal. Head: Atraumatic. Nose: No congestion/rhinnorhea. Mouth/Throat: Mucous membranes are moist.  Posterior oropharynx clear  with no erythema, edema, or exudates. Neck: No tenderness or swelling noted. Cardiovascular: Normal rate, regular rhythm. Grossly normal heart sounds.  2+ radial pulses bilaterally. Respiratory: Normal respiratory effort.  No retractions. Lungs CTAB. Gastrointestinal: Soft and nontender. No distention. Musculoskeletal: No lower extremity tenderness nor edema.  Neurologic:  Normal speech and language. No gross focal neurologic deficits are appreciated.    ED Results / Procedures / Treatments   Labs (all labs ordered are listed, but only abnormal results are displayed) Labs Reviewed - No data to display  RADIOLOGY Chest x-ray reviewed and interpreted by me with no infiltrate, edema, or effusion.  PROCEDURES:  Critical Care performed: No  Procedures   MEDICATIONS ORDERED IN ED: Medications  alum & mag hydroxide-simeth (MAALOX/MYLANTA) 200-200-20 MG/5ML suspension 30 mL (30 mLs Oral Given 10/25/21 0857)    And  lidocaine (XYLOCAINE) 2 % viscous mouth solution 15 mL (15 mLs Oral Given 10/25/21 0857)     IMPRESSION / MDM / ASSESSMENT AND PLAN / ED COURSE  I reviewed the triage vital signs and the nursing notes.  56 y.o. female with past medical history of hypertension and neuropathy who presents to the ED complaining of pain when swallowing and occasional dysphagia with solids for the past 2 to 3 weeks.  Patient's presentation is most consistent with acute complicated illness / injury requiring diagnostic workup.  Differential diagnosis includes, but is not limited to, GERD, esophageal diverticulum, esophageal stricture, malignancy.  Patient nontoxic-appearing and in no acute distress, vital signs are unremarkable.  No changes noted on exam to her posterior oropharynx or neck, abdominal exam is benign.  Patient was offered labs to assess for dehydration and electrolyte abnormality, but declines and states she has been able to tolerate liquids with  minimal difficulty.  We will screen chest x-ray and treat symptomatically with viscous lidocaine and Maalox.  She would likely benefit from outpatient endoscopy for further assessment.  Chest x-ray is unremarkable, patient able to tolerate GI cocktail without difficulty but reports no improvement in pain.  She is appropriate for outpatient GI follow-up and will be started on a PPI in the meantime.  She was counseled to return to the ED for new or worsening symptoms, patient agrees with plan.      FINAL CLINICAL IMPRESSION(S) / ED DIAGNOSES   Final diagnoses:  Dysphagia, unspecified type  Globus sensation     Rx / DC Orders   ED Discharge Orders          Ordered    omeprazole (PRILOSEC OTC) 20 MG tablet  Daily        10/25/21 0936             Note:  This document was prepared using Dragon voice recognition software and may include unintentional dictation errors.   Chesley Noon, MD 10/25/21 [phone]

## 2021-10-25 NOTE — ED Triage Notes (Signed)
Pt reports a  few weeks ago started with some irritation in her throat. Pt reports then it became difficult to swallow food and now it is difficult to swallow liquids as well. Pt states when she tries it hurts and feels like it gets stuck right in the bottom of her throat.

## 2021-10-27 ENCOUNTER — Telehealth: Payer: Self-pay

## 2021-10-27 NOTE — Telephone Encounter (Signed)
Copied from CRM [id number]. Topic: General - Other >> Oct 27, 2021  9:19 AM Crystal Nixon wrote: Patient was seen in the ED because she was unable to swallow. ED referred her to Gastroenterology, but the office they referred her to cannot get her in until next year. Patient wants to know if there is an office that can get her is sooner. Please follow up with patient.

## 2021-10-27 NOTE — Telephone Encounter (Signed)
Please Review

## 2021-10-28 NOTE — Telephone Encounter (Signed)
Called patient to give provider note. Someone else answered and said they would have her call us back. Okay for PEC to advise.

## 2021-10-28 NOTE — Telephone Encounter (Signed)
Patient called and was given the information below from Dr. Linwood Dibbles, she verbalized understanding. She says the earliest she could get was on November 28th at [facility] and she's on the waiting list for cancellations.

## 2021-12-01 ENCOUNTER — Ambulatory Visit: Payer: Self-pay | Admitting: Family Medicine

## 2022-01-20 ENCOUNTER — Encounter: Payer: Self-pay | Admitting: Family Medicine

## 2022-01-20 ENCOUNTER — Ambulatory Visit (INDEPENDENT_AMBULATORY_CARE_PROVIDER_SITE_OTHER): Payer: Self-pay | Admitting: Family Medicine

## 2022-01-20 ENCOUNTER — Ambulatory Visit: Payer: Self-pay

## 2022-01-20 VITALS — BP 133/86 | HR 89 | Resp 18 | Wt 160.0 lb

## 2022-01-20 DIAGNOSIS — Z5989 Other problems related to housing and economic circumstances: Secondary | ICD-10-CM

## 2022-01-20 DIAGNOSIS — Z599 Problem related to housing and economic circumstances, unspecified: Secondary | ICD-10-CM

## 2022-01-20 DIAGNOSIS — J069 Acute upper respiratory infection, unspecified: Secondary | ICD-10-CM

## 2022-01-20 DIAGNOSIS — I10 Essential (primary) hypertension: Secondary | ICD-10-CM

## 2022-01-20 MED ORDER — HYDROCHLOROTHIAZIDE 25 MG PO TABS: 25.0000 mg | ORAL_TABLET | Freq: Every day | ORAL | 1 refills | Status: DC

## 2022-01-20 MED ORDER — PREDNISONE 10 MG (21) PO TBPK: ORAL_TABLET | ORAL | 0 refills | Status: AC

## 2022-01-20 NOTE — Telephone Encounter (Signed)
  Chief Complaint: SOB Symptoms: SOB, dizziness, cold symptoms, been out of BP meds for several weeks Frequency: ongoing for several weeks  Pertinent Negatives:NA Disposition: [] ED /[] Urgent Care (no appt availability in office) / [x] Appointment(In office/virtual)/ []  [place] [facility]/ [] Home Care/ [] Refused Recommended Disposition /[] [place] [facility]/ []  Follow-up with PCP Additional Notes: pt states that she has been staying with her mother in law since her dad passed and not been in the area to come in but now she is back and wanting to be seen. Scheduled appt for today at 1320 with Dr. .   Reason for Disposition  [1] MILD difficulty breathing (e.g., minimal/no SOB at rest, SOB with walking, pulse <100) AND [2] NEW-onset or WORSE than normal  Answer Assessment - Initial Assessment Questions 1. RESPIRATORY STATUS: "Describe your breathing?" (e.g., wheezing, shortness of breath, unable to speak, severe coughing)      SOB 2. ONSET: "When did this breathing problem begin?"      Couple of weeks  3. PATTERN "Does the difficult breathing come and go, or has it been constant since it started?"      Comes and goes  4. SEVERITY: "How bad is your breathing?" (e.g., mild, moderate, severe)    - MILD: No SOB at rest, mild SOB with walking, speaks normally in sentences, can lie down, no retractions, pulse < 100.    - MODERATE: SOB at rest, SOB with minimal exertion and prefers to sit, cannot lie down flat, speaks in phrases, mild retractions, audible wheezing, pulse 100-120.    - SEVERE: Very SOB at rest, speaks in single words, struggling to breathe, sitting hunched forward, retractions, pulse > 120      Mild to moderate at times  6. CARDIAC HISTORY: "Do you have any history of heart disease?" (e.g., heart attack, angina, bypass surgery, angioplasty)      HTN 9. OTHER SYMPTOMS: "Do you have any other symptoms? (e.g., dizziness, runny nose, cough, chest pain, fever)     Dizziness,  cold symptoms  Protocols used: Breathing Difficulty-A-AH

## 2022-01-20 NOTE — Progress Notes (Signed)
   SUBJECTIVE:   CHIEF COMPLAINT / HPI:   Hypertension: - Medications: HCTZ - Compliance: has been out of meds for a while. Did take one of husband's HCTZ same dose this morning.  - Denies any CP, medication SEs, or symptoms of hypotension  UPPER RESPIRATORY TRACT INFECTION - some congestion and SOB x1 week.  - some dizziness with coughing and with standing. - has not tested for COVID. Fever: low grade Cough: yes Shortness of breath: yes Chest pain: yes, with cough Chest congestion: yes Nasal congestion: no Post nasal drip: yes Sore throat: no Headache: mild Ear pain: no  Ear pressure: no  Vomiting: no Sick contacts: no Relief with OTC cold/cough medications: no  Treatments attempted:  tylenol, aleve, mucinex sinus, nyquil   OBJECTIVE:   BP 133/86 (BP Location: Right Arm, Patient Position: Sitting, Cuff Size: Normal)   Pulse 89   Resp 18   Wt 160 lb (72.6 kg)   SpO2 96%   BMI 29.26 kg/m   Gen: well appearing, in NAD Card: RRR Lungs: CTAB Ext: WWP, no edema  ASSESSMENT/PLAN:   HTN At goal on current regimen. Refill provided. Obtain BMP today.   VIRAL URI Mild-mod sx. Outside of treatment and quarantine window, will defer COVID testing. Given lack of relief with OTC decongestant, will provide steroid taper.  Reviewed OTC symptom relief, return and emergency precautions.   Financial difficulty Just finished 2 years of long term disability and now with lack of insurance complicating care. Will refer to social work to help with resources.   Advised to make CPE appt soon.   Caro Laroche, DO

## 2022-01-21 ENCOUNTER — Telehealth: Payer: Self-pay | Admitting: *Deleted

## 2022-01-21 LAB — BASIC METABOLIC PANEL
BUN/Creatinine Ratio: 14 (ref 9–23)
BUN: 13 mg/dL (ref 6–24)
CO2: 25 mmol/L (ref 20–29)
Calcium: 10.2 mg/dL (ref 8.7–10.2)
Chloride: 98 mmol/L (ref 96–106)
Creatinine, Ser: 0.94 mg/dL (ref 0.57–1.00)
Glucose: 95 mg/dL (ref 70–99)
Potassium: 4.6 mmol/L (ref 3.5–5.2)
Sodium: 137 mmol/L (ref 134–144)
eGFR: 72 mL/min/{1.73_m2} (ref >59)

## 2022-01-21 NOTE — Progress Notes (Signed)
  Care Coordination  Outreach Note  01/21/2022 Name: Crystal Nixon MRN: [id number] DOB: [birth year]   Care Coordination Outreach Attempts: An unsuccessful telephone outreach was attempted today to offer the patient information about available care coordination services as a benefit of their health plan.   Referral received   Follow Up Plan:  Additional outreach attempts will be made to offer the patient care coordination information and services.   Encounter Outcome:  No Answer  Burman Nieves, CCMA Care Coordination Care Guide Direct Dial: [phone]

## 2022-01-22 ENCOUNTER — Other Ambulatory Visit: Payer: Self-pay | Admitting: Family Medicine

## 2022-01-22 NOTE — Telephone Encounter (Signed)
Requested medication (s) are due for refill today: expired medication  Requested medication (s) are on the active medication list: yes  Last refill:  02/02/20 #180 0 refills  Future visit scheduled: no   Notes to clinic:  expired medication do you want to renew Rx?     Requested Prescriptions  Pending Prescriptions Disp Refills   gabapentin (NEURONTIN) 300 MG capsule [Pharmacy Med Name: Gabapentin 300 MG Oral Capsule] 180 capsule 0    Sig: TAKE 2 CAPSULES BY MOUTH THREE TIMES DAILY     Neurology: Anticonvulsants - gabapentin Passed - 01/22/2022  4:42 PM      Passed - Cr in normal range and within 360 days    Creatinine, Ser  Date Value Ref Range Status  01/20/2022 0.94 0.57 - 1.00 mg/dL Final         Passed - Completed PHQ-2 or PHQ-9 in the last 360 days      Passed - Valid encounter within last 12 months    Recent Outpatient Visits           2 days ago Essential hypertension   [facility] Caro Laroche, DO   4 months ago Ear pain, bilateral   [facility], [place], PA-C   4 months ago OME (otitis media with effusion), bilateral   [facility] Maple Hudson., MD   8 months ago Essential hypertension   [facility], Marzella Schlein, MD   1 year ago RUQ pain   [facility], Marzella Schlein, MD

## 2022-01-27 NOTE — Progress Notes (Signed)
  Care Coordination   Note   01/27/2022 Name: Crystal Nixon MRN: [id number] DOB: 1966-02-05  Crystal Nixon is a 56 y.o. year old female who sees Bacigalupo, Marzella Schlein, MD for primary care. I reached out to Ricke Hey by phone today to offer care coordination services.  Ms. Stills was given information about Care Coordination services today including:   The Care Coordination services include support from the care team which includes your Nurse Coordinator, Clinical Social Worker, or Pharmacist.  The Care Coordination team is here to help remove barriers to the health concerns and goals most important to you. Care Coordination services are voluntary, and the patient may decline or stop services at any time by request to their care team member.   Care Coordination Consent Status: Patient agreed to services and verbal consent obtained.   Follow up plan:  Telephone appointment with care coordination team member scheduled for:  02/04/2022  Encounter Outcome:  Pt. Scheduled from referral   Burman Nieves, [facility] Care Coordination Care Guide Direct Dial: [phone]

## 2022-02-04 ENCOUNTER — Telehealth: Payer: Self-pay

## 2022-02-04 NOTE — Patient Outreach (Signed)
  Care Coordination   02/04/2022 Name: Crystal Nixon MRN: [id number] DOB: [birth year]   Care Coordination Outreach Attempts:  An unsuccessful telephone outreach was attempted for a scheduled appointment today. Patients spouse indicated the patient was not in the home during today's call.  Follow Up Plan:  Additional outreach attempts will be made to offer the patient care coordination information and services.   Encounter Outcome:  Pt. Request to Call Back   Care Coordination Interventions:  No, not indicated    Bevelyn Ngo, BSW, CDP Social Worker, Certified Dementia Practitioner [facility] Care Management  Care Coordination [phone]

## 2022-02-09 ENCOUNTER — Telehealth: Payer: Self-pay | Admitting: *Deleted

## 2022-02-09 NOTE — Progress Notes (Signed)
  Care Coordination Note  02/09/2022 Name: [name] MRN: [id number] DOB: 21-Mar-1965  Crystal Nixon Shane Melby is a 56 y.o. year old female who is a primary care patient of Beryle Flock, Marzella Schlein, MD and is actively engaged with the care management team. I reached out to Echo Cristopher Estimable by phone today to assist with re-scheduling an initial visit with the BSW  Follow up plan: We have been unable to make contact with the patient for follow up.   2nd unsuccessful outreach to reschedule missed initial with BSW  Burman Nieves, [facility] Care Coordination Care Guide Direct Dial: [phone]

## 2022-02-26 ENCOUNTER — Ambulatory Visit: Payer: Self-pay | Admitting: *Deleted

## 2022-02-26 NOTE — Telephone Encounter (Signed)
Reason for Disposition  [1] Drinking very little AND [2] dehydration suspected (e.g., no urine > 12 hours, very dry mouth, very lightheaded)  Answer Assessment - Initial Assessment Questions 1. VOMITING SEVERITY: "How many times have you vomited in the past 24 hours?"     - MILD:  1 - 2 times/day    - MODERATE: 3 - 5 times/day, decreased oral intake without significant weight loss or symptoms of dehydration    - SEVERE: 6 or more times/day, vomits everything or nearly everything, with significant weight loss, symptoms of dehydration      Cough with vomiting, severe 2. ONSET: "When did the vomiting begin?"      Tuesday- worse today 3. FLUIDS: "What fluids or food have you vomited up today?" "Have you been able to keep any fluids down?"     Broth- sips- 8 oz- patient has been vomiting 4. ABDOMEN PAIN: "Are your having any abdomen pain?" If Yes : "How bad is it and what does it feel like?" (e.g., crampy, dull, intermittent, constant)      no 5. DIARRHEA: "Is there any diarrhea?" If Yes, ask: "How many times today?"      Yes- watery, multiple 6. CONTACTS: "Is there anyone else in the family with the same symptoms?"      no 7. CAUSE: "What do you think is causing your vomiting?"     unsure 8. HYDRATION STATUS: "Any signs of dehydration?" (e.g., dry mouth [not only dry lips], too weak to stand) "When did you last urinate?"     Weak 9. OTHER SYMPTOMS: "Do you have any other symptoms?" (e.g., fever, headache, vertigo, vomiting blood or coffee grounds, recent head injury)     Fever  Protocols used: Vomiting-A-AH

## 2022-02-26 NOTE — Telephone Encounter (Signed)
Noted  

## 2022-02-26 NOTE — Telephone Encounter (Signed)
  Chief Complaint: vomiting Symptoms: vomiting, diarrhea, fever Frequency: Symptoms started Tuesday- getting worse Pertinent Negatives: Patient denies headache, vertigo, vomiting blood or coffee grounds, recent head injury Disposition: [x] ED /[] Urgent Care (no appt availability in office) / [] Appointment(In office/virtual)/ []  [place] [facility]/ [] Home Care/ [] Refused Recommended Disposition /[] [place] [facility]/ []  Follow-up with PCP Additional Notes: High risk for dehydration- ED advised

## 2022-02-26 NOTE — Telephone Encounter (Signed)
FYI

## 2022-04-02 ENCOUNTER — Telehealth: Payer: Self-pay

## 2022-04-02 NOTE — Telephone Encounter (Signed)
Copied from [place] [id number]. Topic: General - Other >> Apr 02, 2022  2:14 PM Sabas Sous wrote: Reason for CRM:  Law group called to confirm if faxed was received from 03/31/2022. Please call   Best contact: [place] from [place]

## 2022-04-07 NOTE — Telephone Encounter (Signed)
Yes message was left to have patient return call to office. No one on patient DPR

## 2022-04-07 NOTE — Telephone Encounter (Addendum)
Romie Minus from the [employer] is calling to follow up on the narrative report for disability that was faxed on 01/25.  Please advise.    Fax- [phone] Callback- [phone]

## 2022-04-07 NOTE — Telephone Encounter (Signed)
Form was given to Crystal Nixon (she was working with me yesterday when form was received) to advise patient that she needs to do a visit to go over it and complete forms.  Crystal Nixon was also told that she would need an appt when we had to do a similar appt for her significant other for the same forms

## 2022-04-07 NOTE — Telephone Encounter (Signed)
Forms are on my desk

## 2022-04-08 NOTE — Telephone Encounter (Signed)
Left detailed message to Romie Minus at [employer] firm advising that patient has not returned our call to schedule an appt.

## 2022-05-12 ENCOUNTER — Ambulatory Visit: Payer: Self-pay | Admitting: Family Medicine

## 2022-05-12 NOTE — Progress Notes (Deleted)
   I,Crystal Nixon S Jameire Kouba,acting as a Education administrator for [name], MD.,have documented all relevant documentation on the behalf of [name], MD,as directed by  [name], MD while in the presence of [name], MD.     Established patient visit   Patient: Crystal Nixon   DOB: 1965-10-11   57 y.o. Female  MRN: [id number] Visit Date: 05/12/2022  Today's healthcare provider: [name], MD   No chief complaint on file.  Subjective    HPI  ***  Medications: Outpatient Medications Prior to Visit  Medication Sig   ARIPiprazole (ABILIFY) 5 MG tablet Take 5 mg by mouth daily. (Patient not taking: Reported on 01/20/2022)   gabapentin (NEURONTIN) 300 MG capsule TAKE 2 CAPSULES BY MOUTH THREE TIMES DAILY   gabapentin (NEURONTIN) 800 MG tablet Take 800 mg by mouth 4 (four) times daily. (Patient not taking: Reported on 01/20/2022)   hydrochlorothiazide (HYDRODIURIL) 25 MG tablet Take 1 tablet (25 mg total) by mouth daily.   nortriptyline (PAMELOR) 10 MG capsule  (Patient not taking: Reported on 01/20/2022)   nortriptyline (PAMELOR) 50 MG capsule Take 50 mg by mouth 2 (two) times daily. (Patient not taking: Reported on 01/20/2022)   omeprazole (PRILOSEC OTC) 20 MG tablet Take 1 tablet (20 mg total) by mouth daily. (Patient not taking: Reported on 01/20/2022)   predniSONE (STERAPRED UNI-PAK 21 TAB) 10 MG (21) TBPK tablet Take per package directions.   No facility-administered medications prior to visit.    Review of Systems  {Labs  Heme  Chem  Endocrine  Serology  Results Review (optional):23779}   Objective    There were no vitals taken for this visit. {Show previous vital signs (optional):23777}  Physical Exam  ***  No results found for any visits on 05/12/22.  Assessment & Plan     ***  No follow-ups on file.      {provider attestation***:1}   [name], MD  [facility] [phone]  (phone) [phone] (fax)  [place]

## 2022-07-30 DIAGNOSIS — R634 Abnormal weight loss: Secondary | ICD-10-CM | POA: Diagnosis not present

## 2022-07-30 DIAGNOSIS — F411 Generalized anxiety disorder: Secondary | ICD-10-CM | POA: Diagnosis not present

## 2022-07-30 DIAGNOSIS — Z8659 Personal history of other mental and behavioral disorders: Secondary | ICD-10-CM | POA: Diagnosis not present

## 2022-07-30 DIAGNOSIS — M79671 Pain in right foot: Secondary | ICD-10-CM | POA: Diagnosis not present

## 2022-07-30 DIAGNOSIS — G8929 Other chronic pain: Secondary | ICD-10-CM | POA: Diagnosis not present

## 2022-07-30 DIAGNOSIS — M545 Low back pain, unspecified: Secondary | ICD-10-CM | POA: Diagnosis not present

## 2022-07-30 DIAGNOSIS — M79672 Pain in left foot: Secondary | ICD-10-CM | POA: Diagnosis not present

## 2022-07-30 DIAGNOSIS — R413 Other amnesia: Secondary | ICD-10-CM | POA: Diagnosis not present

## 2022-07-30 DIAGNOSIS — G629 Polyneuropathy, unspecified: Secondary | ICD-10-CM | POA: Diagnosis not present

## 2022-07-30 DIAGNOSIS — R202 Paresthesia of skin: Secondary | ICD-10-CM | POA: Diagnosis not present

## 2022-08-08 DIAGNOSIS — M545 Low back pain, unspecified: Secondary | ICD-10-CM | POA: Diagnosis not present

## 2022-08-08 DIAGNOSIS — M549 Dorsalgia, unspecified: Secondary | ICD-10-CM | POA: Diagnosis not present

## 2022-08-08 DIAGNOSIS — I10 Essential (primary) hypertension: Secondary | ICD-10-CM | POA: Diagnosis not present

## 2022-08-18 ENCOUNTER — Telehealth: Payer: Self-pay

## 2022-08-18 NOTE — Telephone Encounter (Signed)
Copied from CRM [id number]. Topic: General - Other >> Aug 18, 2022  2:00 PM Clide Dales wrote: Dr. Georgiana Shore called to speak with patient's pcp about medical records needed for patient' disability application. Please advise. [phone]  Left message for Dr. Cherre Huger office to fax over a signed release of information or have the patient stop by our office to sign form.

## 2022-10-02 DIAGNOSIS — S80811A Abrasion, right lower leg, initial encounter: Secondary | ICD-10-CM | POA: Diagnosis not present

## 2022-10-02 DIAGNOSIS — S8991XA Unspecified injury of right lower leg, initial encounter: Secondary | ICD-10-CM | POA: Diagnosis not present

## 2023-01-21 ENCOUNTER — Ambulatory Visit: Payer: Self-pay | Admitting: Family Medicine

## 2023-03-04 ENCOUNTER — Telehealth: Payer: Self-pay | Admitting: Family Medicine

## 2023-03-04 ENCOUNTER — Ambulatory Visit: Payer: Self-pay

## 2023-03-04 DIAGNOSIS — T22212A Burn of second degree of left forearm, initial encounter: Secondary | ICD-10-CM

## 2023-03-04 DIAGNOSIS — L539 Erythematous condition, unspecified: Secondary | ICD-10-CM

## 2023-03-04 MED ORDER — SILVER SULFADIAZINE 1 % EX CREA: 1.0000 | TOPICAL_CREAM | Freq: Two times a day (BID) | CUTANEOUS | 0 refills | Status: AC
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
## 2023-03-04 NOTE — Patient Instructions (Addendum)
Crystal Nixon, thank you for joining Freddy Finner, NP for today's virtual visit.  While this provider is not your primary care provider (PCP), if your PCP is located in our provider database this encounter information will be shared with them immediately following your visit.   A [facility] MyChart account gives you access to today's visit and all your visits, tests, and labs performed at [facility] " click here if you don't have a [facility] MyChart account or go to [email or web address]  Consent: (Patient) Llewellyn Gussie Peralta provided verbal consent for this virtual visit at the beginning of the encounter.  Current Medications:  Current Outpatient Medications:    silver sulfADIAZINE (SILVADENE) 1 % cream, Apply 1 Application topically 2 (two) times daily., Disp: 50 g, Rfl: 0   ARIPiprazole (ABILIFY) 5 MG tablet, Take 5 mg by mouth daily. (Patient not taking: Reported on 01/20/2022), Disp: , Rfl:    gabapentin (NEURONTIN) 300 MG capsule, TAKE 2 CAPSULES BY MOUTH THREE TIMES DAILY, Disp: 180 capsule, Rfl: 1   gabapentin (NEURONTIN) 800 MG tablet, Take 800 mg by mouth 4 (four) times daily. (Patient not taking: Reported on 01/20/2022), Disp: , Rfl:    hydrochlorothiazide (HYDRODIURIL) 25 MG tablet, Take 1 tablet (25 mg total) by mouth daily., Disp: 90 tablet, Rfl: 1   nortriptyline (PAMELOR) 10 MG capsule, , Disp: , Rfl:    nortriptyline (PAMELOR) 50 MG capsule, Take 50 mg by mouth 2 (two) times daily. (Patient not taking: Reported on 01/20/2022), Disp: , Rfl:    omeprazole (PRILOSEC OTC) 20 MG tablet, Take 1 tablet (20 mg total) by mouth daily. (Patient not taking: Reported on 01/20/2022), Disp: 28 tablet, Rfl: 1   predniSONE (STERAPRED UNI-PAK 21 TAB) 10 MG (21) TBPK tablet, Take per package directions., Disp: 21 tablet, Rfl: 0   Medications ordered in this encounter:  Meds ordered this encounter  Medications   silver sulfADIAZINE (SILVADENE) 1 % cream     Sig: Apply 1 Application topically 2 (two) times daily.    Dispense:  50 g    Refill:  0    Supervising Provider:   Merrilee Jansky [[id number]]     *If you need refills on other medications prior to your next appointment, please contact your pharmacy*  Follow-Up: Call back or seek an in-person evaluation if the symptoms worsen or if the condition fails to improve as anticipated.  [facility] [phone]  Other Instructions  Burn Care, Adult A burn is an injury to the skin or the tissues under the skin. It may be caused by a fire, hot liquid or steam, chemicals, electricity, or the sun. There are three types of burns: First degree. These burns are similar to a sunburn. They may cause your skin to be red, slightly swollen, and tender. They may be treated at home. Second degree. These burns are very painful. They may cause your skin to turn very red, swell, leak fluid, look shiny, and blister. In many cases, these burns may be treated at home. If they cover your hands, feet, face, or genitals, get help from a health care provider. Third degree. These burns are the most severe. They may not be painful, but you may feel pain around the edges of them. Your skin may turn white or black and may look charred, dry, and leathery. These burns cause lasting damage. If you get a third-degree burn, get help right away. Treatment will depend on the type of  burn you have. Taking care of your burn can help to prevent pain and infection. It can also help the burn heal more quickly. How to care for a first-degree burn Right after the burn: Rinse or soak the burn under cool water for 5 minutes or more. Put a cool, wet cloth (cool compress) on your skin. This may help with pain. Do not put ice on your burn. This can cause more damage. Caring for the burn Clean and care for the burn as told by your provider. You may be told to: Use mild soap and water to clean the area. Use a clean cloth to pat  the burned area dry after cleaning it. Do not rub or scrub the burn. Put lotion or aloe vera gel on your skin. How to care for a second-degree burn Right after the burn: Rinse or soak the burn under cool water. Do this for 5-10 minutes. Do not put ice on your burn. This can cause more damage. Take off any jewelry or clothing near the burn. Lightly cover the burn with a clean cloth. Caring for the burn Clean and care for the burn as told by your provider. You may be told to: Clean or rinse out the burned area. Put a cream or ointment on the burn. You may need to use an antibiotic cream that has silver in it. This can kill bacteria. Place a germ-free (sterile) dressing over the burn. A dressing is a bandage that is put over a burn to help it heal. Raise (elevate) the injured area above the level of your heart while you are sitting or lying down. How to care for a third-degree burn Right after the burn: Lightly cover the burn with a clean, dry cloth. Get help right away. You may need to: Stay in the hospital. Have surgery to remove burned tissue or get a skin graft. Get fluids through an IV. Caring for the burn Clean and care for the burn as told by your provider. You may be told to: Clean or rinse out the burn. Put a cream or ointment on the burn. Put a sterile dressing in the burned area (packing). Put a sterile dressing over the burn. Use pressure (compression) dressings. Elevate the injured area above the level of your heart while you are sitting or lying down. Wear splints or immobilizers as told by your provider. Do exercises as told by your provider. Rest as told by your provider. Do not do sports or other physical activities until your provider says that you can. How to prevent infection when caring for a burn  Take these steps to prevent infection and more damage to the tissue. Make sure you: Wash your hands with soap and water for at least 20 seconds before and after you  care for your burn. If soap and water are not available, use hand sanitizer. Wear clean gloves as told by your provider. Do not put butter, oil, toothpaste, or other home remedies on the burn. Do not scratch or pick at the burn. Do not break any blisters. Do not peel the skin. Do not rub your burn, even when cleaning it. Check your burn every day for signs of infection. Check for: More redness, swelling, or pain. Warmth. Pus or a bad smell. Red streaks around the burn. Follow these instructions at home Medicines Take over-the-counter and prescription medicines only as told by your provider. If you were prescribed antibiotics, take or apply them as told by your provider. Do not  stop using the antibiotic even if you start to feel better. General instructions Do not use any products that contain nicotine or tobacco. These products include cigarettes, chewing tobacco, and vaping devices, such as e-cigarettes. If you need help quitting, ask your provider. Drink enough fluid to keep your pee (urine) pale yellow. Protect your burn from the sun. Contact a health care provider if: Your burn does not get better, or it gets worse. You have any signs of infection. Your burn starts to look different or gets black or red spots. Your pain does not get better with medicine. You have anxiety or depression after the injury. Get help right away if: You have red streaks near the burn. You are in severe pain. This information is not intended to replace advice given to you by your health care provider. Make sure you discuss any questions you have with your health care provider. Document Revised: 03/12/2022 Document Reviewed: 03/11/2022 Elsevier Patient Education  2024 Elsevier Inc.    If you have been instructed to have an in-person evaluation today at a local Urgent Care facility, please use the link below. It will take you to a list of all of our available [facility], including address,  phone number and hours of operation. Please do not delay care.  [facility]  If you or a family member do not have a primary care provider, use the link below to schedule a visit and establish care. When you choose a [facility] primary care physician or advanced practice provider, you gain a long-term partner in health. Find a Primary Care Provider  Learn more about [facility]'s in-office and virtual care options:  - Get Care Now

## 2023-03-04 NOTE — Progress Notes (Signed)
Virtual Visit Consent   Suprina Adalind Gulas, you are scheduled for a virtual visit with a [facility] provider today. Just as with appointments in the office, your consent must be obtained to participate. Your consent will be active for this visit and any virtual visit you may have with one of our providers in the next 365 days. If you have a MyChart account, a copy of this consent can be sent to you electronically.  As this is a virtual visit, video technology does not allow for your provider to perform a traditional examination. This may limit your provider's ability to fully assess your condition. If your provider identifies any concerns that need to be evaluated in person or the need to arrange testing (such as labs, EKG, etc.), we will make arrangements to do so. Although advances in technology are sophisticated, we cannot ensure that it will always work on either your end or our end. If the connection with a video visit is poor, the visit may have to be switched to a telephone visit. With either a video or telephone visit, we are not always able to ensure that we have a secure connection.  By engaging in this virtual visit, you consent to the provision of healthcare and authorize for your insurance to be billed (if applicable) for the services provided during this visit. Depending on your insurance coverage, you may receive a charge related to this service.  I need to obtain your verbal consent now. Are you willing to proceed with your visit today? Crystal Nixon has provided verbal consent on 03/04/2023 for a virtual visit (video or telephone). Freddy Finner, NP  Date: 03/04/2023 11:20 AM  Virtual Visit via Video Note   I, Freddy Finner, connected with  Crystal Nixon  ([id number], 11/18/1965) on 03/04/23 at 11:15 AM EST by a video-enabled telemedicine application and verified that I am speaking with the correct person using two identifiers.  Location: Patient: Virtual  Visit Location Patient: Home Provider: Virtual Visit Location Provider: Home Office   I discussed the limitations of evaluation and management by telemedicine and the availability of in person appointments. The patient expressed understanding and agreed to proceed.    History of Present Illness: Crystal Nixon is a 57 y.o. who identifies as a female who was assigned female at birth, and is being seen today for a burn  Spilled boiling water on left arm - Monday Small blisters, and one larger blister that is leaking-clear but skin is intact outside the small leaks that occurred.  Using Tylenol and motrin, and numbing spray, and guaze.   Moving arm well and without issue Denies fevers, chills, bleeding, signs of active infection  Problems:  Patient Active Problem List   Diagnosis Date Noted   Hyperglycemia 05/26/2021   Overweight 05/26/2021   Onychomycosis 05/26/2021   SUI (stress urinary incontinence, female) 05/26/2021   Adjustment disorder with mixed anxiety and depressed mood 06/04/2019   Grief 05/15/2019   Peripheral polyneuropathy 01/13/2019   Essential hypertension 01/13/2019   Family history of breast cancer 01/13/2019   Vasomotor symptoms due to menopause 01/13/2019   Allergic rhinitis 12/22/2009    Allergies: No Known Allergies Medications:  Current Outpatient Medications:    ARIPiprazole (ABILIFY) 5 MG tablet, Take 5 mg by mouth daily. (Patient not taking: Reported on 01/20/2022), Disp: , Rfl:    gabapentin (NEURONTIN) 300 MG capsule, TAKE 2 CAPSULES BY MOUTH THREE TIMES DAILY, Disp: 180 capsule, Rfl:  1   gabapentin (NEURONTIN) 800 MG tablet, Take 800 mg by mouth 4 (four) times daily. (Patient not taking: Reported on 01/20/2022), Disp: , Rfl:    hydrochlorothiazide (HYDRODIURIL) 25 MG tablet, Take 1 tablet (25 mg total) by mouth daily., Disp: 90 tablet, Rfl: 1   nortriptyline (PAMELOR) 10 MG capsule, , Disp: , Rfl:    nortriptyline (PAMELOR) 50 MG capsule, Take  50 mg by mouth 2 (two) times daily. (Patient not taking: Reported on 01/20/2022), Disp: , Rfl:    omeprazole (PRILOSEC OTC) 20 MG tablet, Take 1 tablet (20 mg total) by mouth daily. (Patient not taking: Reported on 01/20/2022), Disp: 28 tablet, Rfl: 1   predniSONE (STERAPRED UNI-PAK 21 TAB) 10 MG (21) TBPK tablet, Take per package directions., Disp: 21 tablet, Rfl: 0  Observations/Objective: Patient is well-developed, well-nourished in no acute distress.  Resting comfortably  at home.  Head is normocephalic, atraumatic.  No labored breathing.  Speech is clear and coherent with logical content.  Patient is alert and oriented at baseline.  Left forearm red and blistered  Assessment and Plan:  1. Redness of forearm (Primary)  - silver sulfADIAZINE (SILVADENE) 1 % cream; Apply 1 Application topically 2 (two) times daily.  Dispense: 50 g; Refill: 0  2. Partial thickness burn of left forearm, initial encounter  - silver sulfADIAZINE (SILVADENE) 1 % cream; Apply 1 Application topically 2 (two) times daily.  Dispense: 50 g; Refill: 0   -burn care reviewed and discussed and on AVS -use cream as directed -in person precautions reviewed   Reviewed side effects, risks and benefits of medication.    Patient acknowledged agreement and understanding of the plan.   Past Medical, Surgical, Social History, Allergies, and Medications have been Reviewed.    Follow Up Instructions: I discussed the assessment and treatment plan with the patient. The patient was provided an opportunity to ask questions and all were answered. The patient agreed with the plan and demonstrated an understanding of the instructions.  A copy of instructions were sent to the patient via MyChart unless otherwise noted below.    The patient was advised to call back or seek an in-person evaluation if the symptoms worsen or if the condition fails to improve as anticipated.    Freddy Finner, NP

## 2023-04-29 ENCOUNTER — Other Ambulatory Visit: Payer: Self-pay | Admitting: Family Medicine

## 2023-04-30 NOTE — Telephone Encounter (Signed)
Requested medications are due for refill today.  Unsure  Requested medications are on the active medications list.  yes  Last refill. 01/22/2022 #180 1 rf  Future visit scheduled.   no  Notes to clinic.  Labs are expired. Pt has not been seen in 1 year.    Requested Prescriptions  Pending Prescriptions Disp Refills   gabapentin (NEURONTIN) 300 MG capsule [Pharmacy Med Name: Gabapentin 300 MG Oral Capsule] 180 capsule 0    Sig: TAKE 2 CAPSULES BY MOUTH THREE TIMES DAILY     Neurology: Anticonvulsants - gabapentin Failed - 04/30/2023  9:09 AM      Failed - Cr in normal range and within 360 days    Creatinine, Ser  Date Value Ref Range Status  01/20/2022 0.94 0.57 - 1.00 mg/dL Final         Failed - Completed PHQ-2 or PHQ-9 in the last 360 days      Failed - Valid encounter within last 12 months    Recent Outpatient Visits           1 year ago Essential hypertension   [facility] Caro Laroche, DO   1 year ago Ear pain, bilateral   [facility] [place], [place], PA-C   1 year ago OME (otitis media with effusion), bilateral   [facility] [place]., MD   1 year ago Essential hypertension   [facility] [place], Marzella Schlein, MD   3 years ago RUQ pain   [facility] [place], Marzella Schlein, MD

## 2023-06-03 ENCOUNTER — Ambulatory Visit: Payer: Self-pay

## 2023-06-03 ENCOUNTER — Ambulatory Visit
Admission: EM | Admit: 2023-06-03 | Discharge: 2023-06-03 | Disposition: A | Discharge status: Home / Self Care | Attending: Emergency Medicine | Admitting: Emergency Medicine

## 2023-06-03 DIAGNOSIS — S0101XA Laceration without foreign body of scalp, initial encounter: Secondary | ICD-10-CM

## 2023-06-03 DIAGNOSIS — S0990XA Unspecified injury of head, initial encounter: Secondary | ICD-10-CM

## 2023-06-03 MED ORDER — CEPHALEXIN 500 MG PO CAPS: 500.0000 mg | ORAL_CAPSULE | Freq: Three times a day (TID) | ORAL | 0 refills | Status: AC

## 2023-06-03 NOTE — ED Triage Notes (Addendum)
 Patient to Urgent Care with complaints of a laceration present to the top of her scalp. Bleeding well controlled with guaze in place.   Incident occurred today around 1pm. Reports a cast iron pan handle hit the center of her head after she was reaching for it.   TDAP 03/14/2019.

## 2023-06-03 NOTE — Telephone Encounter (Signed)
 Got a hold of patient and confirmed she is going to [facility] in [place] for the head laceration treatment/repair.

## 2023-06-03 NOTE — Telephone Encounter (Addendum)
  Chief Complaint: head laceration Symptoms: laceration to left scalp and bleeding Frequency: x about an hour Pertinent Negatives: Patient denies N/A. Disposition: [x] ED /[] Urgent Care (no appt availability in office) / [] Appointment(In office/virtual)/ []  [facility]/ [] Home Care/ [] Refused Recommended Disposition /[] [facility]/ []  Follow-up with PCP Additional Notes: Patient states she was grabbing an iron skillet off a shelf and it hit her head. She states this happened about an hour ago. Patient states she has been applying pressure for over 15 minutes and the bleeding starts back up if she stops applying pressure. Patient insists she would like to see her PCP for this and states "last time I did a virtual visit and didn't come in and Dr. B told me that I should've come to the office." Advised patient that the providers at her office do not place sutures or staples. Patient still requesting appt with PCP. Called [facility] and spoke with Alena who confirmed patient needs to go to urgent care or ED. Unable to hear patient when resuming call, informed her to go to urgent care or ED. Attempted to call back twice and received "call cannot be completed as dialed" message.   Copied from CRM [id number]. Topic: Clinical - Red Word Triage >> Jun 03, 2023 12:59 PM Clide Dales wrote: Red Word that prompted transfer to Nurse Triage: cut on head that will not stop bleeding Reason for Disposition  [1] Bleeding AND [2] won't stop after 10 minutes of direct pressure (using correct technique)  Answer Assessment - Initial Assessment Questions 1. APPEARANCE of INJURY: "What does the injury look like?"      Front left scalp injury, states with skin split open.  2. SIZE: "How large is the cut?"      Unsure how large it is, she states she had her neighbor look at it and she said it looks like it needs stitches.  3. BLEEDING: "Is it bleeding now?" If Yes, ask: "Is it difficult to stop?"      Yes, she  states she has been holding constant pressure on it. She states if she lets go it will continue.  4. LOCATION: "Where is the injury located?"      Scalp, top left.  5. ONSET: "How long ago did the injury occur?"      Today about an hour ago.  6. MECHANISM: "Tell me how it happened."      She states she was trying to reach for a iron skillet from a shelf, it fell out of her hands and hit her on the head.   7. TETANUS: "When was the last tetanus booster?"     2021.  8. PREGNANCY: "Is there any chance you are pregnant?" "When was your last menstrual period?"     N/A.  Protocols used: Cuts and Lacerations-A-AH

## 2023-06-03 NOTE — Telephone Encounter (Signed)
 Noted.

## 2023-06-03 NOTE — Discharge Instructions (Addendum)
 Your staples need to be taken out in 7 days.    Wash your wound gently twice a day with soap and water.  Apply a topical antibiotic ointment.  See the attached information on laceration care.  Take the cephalexin as directed.    Follow-up right away if you note signs of infection such as redness, pus, fever.    See the attached information on head injury.  Go to the emergency department if you have concerning symptoms.

## 2023-06-03 NOTE — ED Provider Notes (Signed)
 Crystal Nixon    CSN: [id number] Arrival date & time: 06/03/23  1357      History   Chief Complaint Chief Complaint  Patient presents with   Laceration    HPI Crystal Nixon is a 58 y.o. female.  Patient presents with a laceration on her scalp that occurred this afternoon when she was taking an iron skillet out of a cabinet and it fell.  The handle struck her on the head.  She denies loss of consciousness.  She denies dizziness, weakness, numbness, headache, change in vision, vomiting.  Bleeding controlled with direct pressure.  Patient is not on anticoagulants.  Last tetanus 2021.  The history is provided by the patient and medical records.    Past Medical History:  Diagnosis Date   COVID-19 virus infection    Neuropathy     Patient Active Problem List   Diagnosis Date Noted   Hyperglycemia 05/26/2021   Overweight 05/26/2021   Onychomycosis 05/26/2021   SUI (stress urinary incontinence, female) 05/26/2021   Adjustment disorder with mixed anxiety and depressed mood 06/04/2019   Grief 05/15/2019   Peripheral polyneuropathy 01/13/2019   Essential hypertension 01/13/2019   Family history of breast cancer 01/13/2019   Vasomotor symptoms due to menopause 01/13/2019   Allergic rhinitis 12/22/2009    Past Surgical History:  Procedure Laterality Date   ABDOMINAL HYSTERECTOMY     APPENDECTOMY      OB History   No obstetric history on file.      Home Medications    Prior to Admission medications   Medication Sig Start Date End Date Taking? Authorizing Provider  cephALEXin (KEFLEX) 500 MG capsule Take 1 capsule (500 mg total) by mouth 3 (three) times daily. 06/03/23  Yes Mickie Bail, NP  ARIPiprazole (ABILIFY) 5 MG tablet Take 5 mg by mouth daily. Patient not taking: Reported on 01/20/2022    [provider]  gabapentin (NEURONTIN) 300 MG capsule TAKE 2 CAPSULES BY MOUTH THREE TIMES DAILY 01/22/22   Bacigalupo, Marzella Schlein, MD  gabapentin  (NEURONTIN) 800 MG tablet Take 800 mg by mouth 4 (four) times daily. Patient not taking: Reported on 01/20/2022    [provider]  hydrochlorothiazide (HYDRODIURIL) 25 MG tablet Take 1 tablet by mouth once daily 04/29/23   Erasmo Downer, MD  nortriptyline (PAMELOR) 10 MG capsule  07/07/19   [provider]  nortriptyline (PAMELOR) 50 MG capsule Take 50 mg by mouth 2 (two) times daily. Patient not taking: Reported on 01/20/2022    [provider]  omeprazole (PRILOSEC OTC) 20 MG tablet Take 1 tablet (20 mg total) by mouth daily. Patient not taking: Reported on 01/20/2022 10/25/21 10/25/22  Chesley Noon, MD  predniSONE (STERAPRED UNI-PAK 21 TAB) 10 MG (21) TBPK tablet Take per package directions. Patient not taking: Reported on 06/03/2023 01/20/22   Caro Laroche, DO  silver sulfADIAZINE (SILVADENE) 1 % cream Apply 1 Application topically 2 (two) times daily. Patient not taking: Reported on 06/03/2023 03/04/23   Freddy Finner, NP    Family History Family History  Problem Relation Age of Onset   Breast cancer Mother 42   ALS Mother    Stroke Father    Breast cancer Maternal Grandmother        lived to 56   Colon cancer Neg Hx     Social History Social History   Tobacco Use   Smoking status: Never   Smokeless tobacco: Never  Vaping Use  Vaping status: Never Used  Substance Use Topics   Alcohol use: Never   Drug use: Never     Allergies   Patient has no known allergies.   Review of Systems Review of Systems  HENT:  Negative for ear discharge and rhinorrhea.   Eyes:  Negative for visual disturbance.  Gastrointestinal:  Negative for nausea and vomiting.  Musculoskeletal:  Negative for back pain, gait problem and neck pain.  Skin:  Positive for wound. Negative for color change.  Neurological:  Negative for dizziness, syncope, weakness, light-headedness, numbness and headaches.     Physical Exam Triage Vital Signs ED Triage Vitals   Encounter Vitals Group     BP 06/03/23 1456 135/85     Systolic BP Percentile --      Diastolic BP Percentile --      Pulse Rate 06/03/23 1456 90     Resp 06/03/23 1456 18     Temp 06/03/23 1456 97.9 F (36.6 C)     Temp src --      SpO2 06/03/23 1456 97 %     Weight --      Height --      Head Circumference --      Peak Flow --      Pain Score 06/03/23 1455 2     Pain Loc --      Pain Education --      Exclude from Growth Chart --    No data found.  Updated Vital Signs BP 135/85   Pulse 90   Temp 97.9 F (36.6 C)   Resp 18   SpO2 97%   Visual Acuity Right Eye Distance:   Left Eye Distance:   Bilateral Distance:    Right Eye Near:   Left Eye Near:    Bilateral Near:     Physical Exam Constitutional:      General: She is not in acute distress. HENT:     Mouth/Throat:     Mouth: Mucous membranes are moist.  Cardiovascular:     Rate and Rhythm: Normal rate and regular rhythm.  Pulmonary:     Effort: Pulmonary effort is normal. No respiratory distress.  Skin:    General: Skin is warm and dry.     Findings: Lesion present.     Comments: Scalp laceration.  Bleeding controlled.  Neurological:     General: No focal deficit present.     Mental Status: She is alert and oriented to person, place, and time.     Sensory: No sensory deficit.     Motor: No weakness.     Gait: Gait normal.      UC Treatments / Results  Labs (all labs ordered are listed, but only abnormal results are displayed) Labs Reviewed - No data to display  EKG   Radiology No results found.  Procedures Laceration Repair  Date/Time: 06/03/2023 3:26 PM  Performed by: Mickie Bail, NP Authorized by: Mickie Bail, NP   Consent:    Consent obtained:  Verbal   Consent given by:  Patient   Risks discussed:  Infection, pain, poor cosmetic result and poor wound healing Universal protocol:    Procedure explained and questions answered to patient or proxy's satisfaction: yes    Anesthesia:    Anesthesia method:  Local infiltration   Local anesthetic:  Lidocaine 1% WITH epi Laceration details:    Location:  Scalp   Scalp location:  Frontal   Length (cm):  1.5  Depth (mm):  2 Pre-procedure details:    Preparation:  Patient was prepped and draped in usual sterile fashion Exploration:    Hemostasis achieved with:  Direct pressure   Imaging outcome: foreign body not noted     Wound exploration: wound explored through full range of motion and entire depth of wound visualized   Treatment:    Area cleansed with:  Shur-Clens   Amount of cleaning:  Standard   Irrigation solution:  Sterile water   Irrigation method:  Syringe   Visualized foreign bodies/material removed: no   Skin repair:    Repair method:  Staples   Number of staples:  3 Approximation:    Approximation:  Close Repair type:    Repair type:  Simple Post-procedure details:    Dressing:  Antibiotic ointment   Procedure completion:  Tolerated well, no immediate complications  (including critical care time)  Medications Ordered in UC Medications - No data to display  Initial Impression / Assessment and Plan / UC Course  I have reviewed the triage vital signs and the nursing notes.  Pertinent labs & imaging results that were available during my care of the patient were reviewed by me and considered in my medical decision making (see chart for details).   Scalp laceration, head injury.  Afebrile and vital signs are stable.  No loss of consciousness.  3 staples.  Tetanus is up-to-date (2021).  Wound care instructions discussed.  Treating prophylactically with cephalexin.  Instructed patient to return here for staple removal in 7 days.  Instructed her to return right away if she notes signs of infection.  Education provided on laceration care.  ED precautions given for head injury.  Education provided on head injury.  Patient agrees to plan of care.    Final Clinical Impressions(s) / UC Diagnoses    Final diagnoses:  Laceration of scalp without foreign body, initial encounter  Injury of head, initial encounter     Discharge Instructions      Your staples need to be taken out in 7 days.    Wash your wound gently twice a day with soap and water.  Apply a topical antibiotic ointment.  See the attached information on laceration care.  Take the cephalexin as directed.    Follow-up right away if you note signs of infection such as redness, pus, fever.    See the attached information on head injury.  Go to the emergency department if you have concerning symptoms.       ED Prescriptions     Medication Sig Dispense Auth. Provider   cephALEXin (KEFLEX) 500 MG capsule Take 1 capsule (500 mg total) by mouth 3 (three) times daily. 21 capsule Mickie Bail, NP      PDMP not reviewed this encounter.   Mickie Bail, NP 06/03/23 [phone]

## 2023-06-13 ENCOUNTER — Ambulatory Visit
Admission: EM | Admit: 2023-06-13 | Discharge: 2023-06-13 | Disposition: A | Discharge status: Home / Self Care | Attending: Emergency Medicine | Admitting: Emergency Medicine

## 2023-06-13 DIAGNOSIS — Z4802 Encounter for removal of sutures: Secondary | ICD-10-CM | POA: Diagnosis not present

## 2023-06-13 NOTE — ED Triage Notes (Signed)
 Patient presents to Urgent Care for staple removal.  3 staples to scalp placed 3/27. Area well-healed.  Denies any other complaints.

## 2023-10-20 ENCOUNTER — Other Ambulatory Visit: Payer: Self-pay | Admitting: Family Medicine

## 2023-10-20 NOTE — Telephone Encounter (Signed)
 Patient needs appointment for further refills

## 2023-12-13 DIAGNOSIS — G629 Polyneuropathy, unspecified: Secondary | ICD-10-CM | POA: Diagnosis not present

## 2023-12-13 DIAGNOSIS — R413 Other amnesia: Secondary | ICD-10-CM | POA: Diagnosis not present

## 2023-12-13 DIAGNOSIS — R2689 Other abnormalities of gait and mobility: Secondary | ICD-10-CM | POA: Diagnosis not present

## 2023-12-13 DIAGNOSIS — R208 Other disturbances of skin sensation: Secondary | ICD-10-CM | POA: Diagnosis not present

## 2023-12-13 DIAGNOSIS — Z1331 Encounter for screening for depression: Secondary | ICD-10-CM | POA: Diagnosis not present

## 2023-12-13 DIAGNOSIS — R262 Difficulty in walking, not elsewhere classified: Secondary | ICD-10-CM | POA: Diagnosis not present

## 2023-12-13 DIAGNOSIS — F4321 Adjustment disorder with depressed mood: Secondary | ICD-10-CM | POA: Diagnosis not present

## 2023-12-13 DIAGNOSIS — R202 Paresthesia of skin: Secondary | ICD-10-CM | POA: Diagnosis not present

## 2023-12-13 DIAGNOSIS — R200 Anesthesia of skin: Secondary | ICD-10-CM | POA: Diagnosis not present

## 2024-02-14 ENCOUNTER — Ambulatory Visit: Admitting: Family Medicine
